# Patient Record
Sex: Female | Born: 1985 | Race: Black or African American | Hispanic: No | Marital: Single | Smoking: Never smoker
Health system: Southern US, Community
[De-identification: ages and names within clinical notes are randomized; demographics above are authoritative.]

## PROBLEM LIST (undated history)

## (undated) ENCOUNTER — Inpatient Hospital Stay (HOSPITAL_COMMUNITY): Payer: Self-pay

## (undated) DIAGNOSIS — B009 Herpesviral infection, unspecified: Secondary | ICD-10-CM

## (undated) DIAGNOSIS — Z789 Other specified health status: Secondary | ICD-10-CM

## (undated) DIAGNOSIS — L309 Dermatitis, unspecified: Secondary | ICD-10-CM

## (undated) DIAGNOSIS — R51 Headache: Secondary | ICD-10-CM

## (undated) HISTORY — PX: KNEE ARTHROSCOPY: SUR90

## (undated) HISTORY — PX: DILATION AND CURETTAGE OF UTERUS: SHX78

## (undated) HISTORY — PX: KNEE SURGERY: SHX244

---

## 2013-02-28 ENCOUNTER — Inpatient Hospital Stay (HOSPITAL_COMMUNITY)
Admission: AD | Admit: 2013-02-28 | Discharge: 2013-02-28 | Disposition: A | Discharge status: Home / Self Care | Payer: Self-pay | Source: Ambulatory Visit | Attending: Obstetrics & Gynecology | Admitting: Obstetrics & Gynecology

## 2013-02-28 ENCOUNTER — Inpatient Hospital Stay (HOSPITAL_COMMUNITY): Payer: Self-pay

## 2013-02-28 ENCOUNTER — Encounter (HOSPITAL_COMMUNITY): Payer: Self-pay | Admitting: Medical

## 2013-02-28 DIAGNOSIS — O26899 Other specified pregnancy related conditions, unspecified trimester: Secondary | ICD-10-CM

## 2013-02-28 DIAGNOSIS — N949 Unspecified condition associated with female genital organs and menstrual cycle: Secondary | ICD-10-CM | POA: Insufficient documentation

## 2013-02-28 DIAGNOSIS — R109 Unspecified abdominal pain: Secondary | ICD-10-CM | POA: Insufficient documentation

## 2013-02-28 DIAGNOSIS — Z3490 Encounter for supervision of normal pregnancy, unspecified, unspecified trimester: Secondary | ICD-10-CM

## 2013-02-28 DIAGNOSIS — O99891 Other specified diseases and conditions complicating pregnancy: Secondary | ICD-10-CM | POA: Insufficient documentation

## 2013-02-28 HISTORY — DX: Herpesviral infection, unspecified: B00.9

## 2013-02-28 LAB — CBC
MCH: 28.9 pg (ref 26.0–34.0)
MCHC: 36.5 g/dL — ABNORMAL HIGH (ref 30.0–36.0)
MCV: 79.1 fL (ref 78.0–100.0)
Platelets: 276 10*3/uL (ref 150–400)

## 2013-02-28 LAB — URINALYSIS, ROUTINE W REFLEX MICROSCOPIC
Bilirubin Urine: NEGATIVE
Glucose, UA: NEGATIVE mg/dL
Hgb urine dipstick: NEGATIVE
Specific Gravity, Urine: 1.01 (ref 1.005–1.030)
pH: 6.5 (ref 5.0–8.0)

## 2013-02-28 LAB — ABO/RH: ABO/RH(D): O POS

## 2013-02-28 NOTE — MAU Note (Signed)
Pt states she was having abdominal cramping for about 3wks before pt before she found out she was pregnant.Pt states she doesn't feel the pain anymore she feels hungry

## 2013-02-28 NOTE — MAU Note (Signed)
Patient states she has had a positive pregnancy test at [facility]. Has been having lower abdominal pain for about 2 weeks off and on. Denies bleeding but does have a discharge. Nausea, no vomiting.

## 2013-02-28 NOTE — MAU Provider Note (Signed)
History     CSN: [id number]  Arrival date and time: 02/28/13 1128   None     Chief Complaint  Patient presents with  . Possible Pregnancy  . Abdominal Pain   HPI Megan Pena is 27 y.o. G1P0 [redacted]w[redacted]d weeks presenting with 2 week history of abdominal pain and vaginal discharge.  She had a positive UPT at [facility].  They told her she had BV but discharge papers said PID--no med was Rx because she was pregnant.  Sexually active with 1 partner X 8 months.     Past Medical History  Diagnosis Date  . Herpes     Past Surgical History  Procedure Laterality Date  . Knee surgery      Family History  Problem Relation Age of Onset  . Cancer Mother   . Asthma Mother   . Cancer Father   . GER disease Brother     History  Substance Use Topics  . Smoking status: Never Smoker   . Smokeless tobacco: Not on file  . Alcohol Use: No    Allergies: No Known Allergies  Prescriptions prior to admission  Medication Sig Dispense Refill  . ibuprofen (ADVIL,MOTRIN) 200 MG tablet Take 400 mg by mouth every 8 (eight) hours as needed for pain or headache.      . triamcinolone cream (KENALOG) 0.1 % Apply 1 application topically 2 (two) times daily.        Review of Systems  Constitutional: Negative for fever and chills.  Gastrointestinal: Positive for abdominal pain. Negative for nausea and vomiting.  Genitourinary: Negative for dysuria, urgency and frequency.       + for vaginal discharge Neg for vaginal bleeding  Neurological: Negative for headaches.   Physical Exam   Blood pressure 123/77, pulse 84, temperature 98.4 F (36.9 C), temperature source Oral, resp. rate 16, height 5\' 2"  (1.575 m), weight 149 lb 14.4 oz (67.994 kg), last menstrual period 01/11/2013, SpO2 100.00%.  Physical Exam  Nursing note and vitals reviewed. Constitutional: She is oriented to person, place, and time. She appears well-developed and well-nourished. No distress.  HENT:  Head: Normocephalic.  Neck:  Normal range of motion.  Cardiovascular: Normal rate.   Respiratory: Effort normal.  GI: Soft. She exhibits no distension and no mass. There is no tenderness. There is no rebound and no guarding.  Genitourinary: There is no rash, tenderness or lesion on the right labia. There is no rash, tenderness or lesion on the left labia. Uterus is enlarged (slightly). Uterus is not tender. Cervix exhibits no motion tenderness, no discharge and no friability. Right adnexum displays no mass, no tenderness and no fullness. Left adnexum displays no mass, no tenderness and no fullness. No erythema, tenderness or bleeding around the vagina. Vaginal discharge (moderate amount of vaginal discharge that is frothy) found.  Neurological: She is alert and oriented to person, place, and time.  Skin: Skin is warm and dry.  Psychiatric: She has a normal mood and affect. Her behavior is normal.   Results for orders placed during the hospital encounter of 02/28/13 (from the past 24 hour(s))  URINALYSIS, ROUTINE W REFLEX MICROSCOPIC     Status: None   Collection Time    02/28/13 12:00 PM      Result Value Range   Color, Urine YELLOW  YELLOW   APPearance CLEAR  CLEAR   Specific Gravity, Urine 1.010  1.005 - 1.030   pH 6.5  5.0 - 8.0   Glucose, UA NEGATIVE  NEGATIVE mg/dL   Hgb urine dipstick NEGATIVE  NEGATIVE   Bilirubin Urine NEGATIVE  NEGATIVE   Ketones, ur NEGATIVE  NEGATIVE mg/dL   Protein, ur NEGATIVE  NEGATIVE mg/dL   Urobilinogen, UA 0.2  0.0 - 1.0 mg/dL   Nitrite NEGATIVE  NEGATIVE   Leukocytes, UA NEGATIVE  NEGATIVE  POCT PREGNANCY, URINE     Status: Abnormal   Collection Time    02/28/13 12:22 PM      Result Value Range   Preg Test, Ur POSITIVE (*) NEGATIVE  CBC     Status: Abnormal   Collection Time    02/28/13 12:36 PM      Result Value Range   WBC 7.2  4.0 - 10.5 K/uL   RBC 4.12  3.87 - 5.11 MIL/uL   Hemoglobin 11.9 (*) 12.0 - 15.0 g/dL   HCT 16.1 (*) 09.6 - 04.5 %   MCV 79.1  78.0 - 100.0 fL     MCH 28.9  26.0 - 34.0 pg   MCHC 36.5 (*) 30.0 - 36.0 g/dL   RDW 40.9  81.1 - 91.4 %   Platelets 276  150 - 400 K/uL  ABO/RH     Status: None   Collection Time    02/28/13 12:36 PM      Result Value Range   ABO/RH(D) O POS    HCG, QUANTITATIVE, PREGNANCY     Status: Abnormal   Collection Time    02/28/13 12:36 PM      Result Value Range   hCG, Beta Chain, Quant, S 78295 (*) <5 mIU/mL  WET PREP, GENITAL     Status: Abnormal   Collection Time    02/28/13  3:45 PM      Result Value Range   Yeast Wet Prep HPF POC NONE SEEN  NONE SEEN   Trich, Wet Prep NONE SEEN  NONE SEEN   Clue Cells Wet Prep HPF POC FEW (*) NONE SEEN   WBC, Wet Prep HPF POC MODERATE (*) NONE SEEN     MAU Course  Procedures GC/CHL culture to lab  MDM   Assessment and Plan  A: Viable intrauterine pregnancy at [redacted]w[redacted]d gestation     Abdominal pain in early pregnancy   P:  Begin prenatal care with doctor of your choice      Tylenol prn discomfort      Begin prenatal vitamins OTC       Daekwon Beswick,EVE M 02/28/2013, 4:17 PM

## 2013-03-01 LAB — GC/CHLAMYDIA PROBE AMP
CT Probe RNA: NEGATIVE
GC Probe RNA: NEGATIVE

## 2013-03-01 NOTE — MAU Provider Note (Signed)
Attestation of Attending Supervision of Advanced Practitioner (PA/CNM/NP): Evaluation and management procedures were performed by the Advanced Practitioner under my supervision and collaboration.  I have reviewed the Advanced Practitioner's note and chart, and I agree with the management and plan.  Malai Lady, MD, FACOG Attending Obstetrician & Gynecologist Faculty Practice, [facility]  

## 2013-07-12 ENCOUNTER — Encounter (HOSPITAL_BASED_OUTPATIENT_CLINIC_OR_DEPARTMENT_OTHER): Payer: Self-pay | Admitting: Emergency Medicine

## 2013-07-12 ENCOUNTER — Emergency Department (HOSPITAL_BASED_OUTPATIENT_CLINIC_OR_DEPARTMENT_OTHER): Payer: Medicaid Other

## 2013-07-12 ENCOUNTER — Emergency Department (HOSPITAL_BASED_OUTPATIENT_CLINIC_OR_DEPARTMENT_OTHER)
Admission: EM | Admit: 2013-07-12 | Discharge: 2013-07-12 | Disposition: A | Discharge status: Home / Self Care | Payer: Medicaid Other | Attending: Emergency Medicine | Admitting: Emergency Medicine

## 2013-07-12 DIAGNOSIS — R059 Cough, unspecified: Secondary | ICD-10-CM | POA: Insufficient documentation

## 2013-07-12 DIAGNOSIS — Z79899 Other long term (current) drug therapy: Secondary | ICD-10-CM | POA: Insufficient documentation

## 2013-07-12 DIAGNOSIS — R1110 Vomiting, unspecified: Secondary | ICD-10-CM | POA: Insufficient documentation

## 2013-07-12 DIAGNOSIS — R071 Chest pain on breathing: Secondary | ICD-10-CM | POA: Insufficient documentation

## 2013-07-12 DIAGNOSIS — R05 Cough: Secondary | ICD-10-CM | POA: Insufficient documentation

## 2013-07-12 DIAGNOSIS — Z8619 Personal history of other infectious and parasitic diseases: Secondary | ICD-10-CM | POA: Insufficient documentation

## 2013-07-12 DIAGNOSIS — IMO0002 Reserved for concepts with insufficient information to code with codable children: Secondary | ICD-10-CM | POA: Insufficient documentation

## 2013-07-12 DIAGNOSIS — J3489 Other specified disorders of nose and nasal sinuses: Secondary | ICD-10-CM | POA: Insufficient documentation

## 2013-07-12 MED ORDER — HYDROCODONE-ACETAMINOPHEN 7.5-325 MG/15ML PO SOLN
10.0000 mL | Freq: Once | ORAL | Status: AC
  Administered 2013-07-12: 10 mL via ORAL
  Filled 2013-07-12: qty 15

## 2013-07-12 MED ORDER — HYDROCODONE-HOMATROPINE 5-1.5 MG/5ML PO SYRP
5.0000 mL | ORAL_SOLUTION | Freq: Once | ORAL | Status: DC
  Filled 2013-07-12: qty 5

## 2013-07-12 MED ORDER — GUAIFENESIN-CODEINE 100-10 MG/5ML PO SOLN: 5.0000 mL | ORAL | Status: DC | PRN

## 2013-07-12 NOTE — ED Notes (Signed)
Returned from xray

## 2013-07-12 NOTE — ED Provider Notes (Signed)
CSN: [id number]     Arrival date & time 07/12/13  2144 History   First MD Initiated Contact with Patient 07/12/13 2303     Chief Complaint  Patient presents with  . Cough   (Consider location/radiation/quality/duration/timing/severity/associated sxs/prior Treatment) HPI Patient presents with 2-3 weeks of cough productive of yellow sputum. She says she does have occasional posttussive emesis. She has no shortness of breath has occasional anterior chest pain with coughing. She also admits to nasal congestion/rhinorrhea. No fevers or chills. He has no lower extremity swelling or pain. Past Medical History  Diagnosis Date  . Herpes    Past Surgical History  Procedure Laterality Date  . Knee surgery     Family History  Problem Relation Age of Onset  . Cancer Mother   . Asthma Mother   . Cancer Father   . GER disease Brother    History  Substance Use Topics  . Smoking status: Never Smoker   . Smokeless tobacco: Not on file  . Alcohol Use: No   OB History   Grav Para Term Preterm Abortions TAB SAB Ect Mult Living   1              Review of Systems  Constitutional: Negative for fever, chills and fatigue.  HENT: Positive for congestion and rhinorrhea. Negative for ear pain, sinus pressure and sore throat.   Respiratory: Positive for cough. Negative for shortness of breath and wheezing.   Cardiovascular: Positive for chest pain. Negative for palpitations and leg swelling.  Gastrointestinal: Negative for nausea, vomiting, abdominal pain and diarrhea.  Musculoskeletal: Negative for back pain, neck pain and neck stiffness.  Skin: Negative for rash and wound.  Neurological: Negative for dizziness, weakness, numbness and headaches.  All other systems reviewed and are negative.    Allergies  Review of patient's allergies indicates no known allergies.  Home Medications   Current Outpatient Rx  Name  Route  Sig  Dispense  Refill  . norgestimate-ethinyl estradiol  (ORTHO-CYCLEN,SPRINTEC,PREVIFEM) 0.25-35 MG-MCG tablet   Oral   Take 1 tablet by mouth daily.         Marland Kitchen guaiFENesin-codeine 100-10 MG/5ML syrup   Oral   Take 5 mLs by mouth every 4 (four) hours as needed for cough.   120 mL   0   . triamcinolone cream (KENALOG) 0.1 %   Topical   Apply 1 application topically 2 (two) times daily.          BP 119/73  Pulse 71  Temp(Src) 98.7 F (37.1 C) (Oral)  Resp 14  Ht 5\' 2"  (1.575 m)  Wt 140 lb (63.504 kg)  BMI 25.60 kg/m2  SpO2 99%  LMP 07/05/2013  Breastfeeding? No Physical Exam  Nursing note and vitals reviewed. Constitutional: She is oriented to person, place, and time. She appears well-developed and well-nourished. No distress.  HENT:  Head: Normocephalic and atraumatic.  Mouth/Throat: Oropharynx is clear and moist.  Eyes: EOM are normal. Pupils are equal, round, and reactive to light.  Neck: Normal range of motion. Neck supple.  Cardiovascular: Normal rate and regular rhythm.   Pulmonary/Chest: Effort normal and breath sounds normal. No respiratory distress. She has no wheezes. She has no rales. She exhibits tenderness (tenderness to chest reproduced with palpation of the sternum and parasternal area.no crepitance or deformity.).  Abdominal: Soft. Bowel sounds are normal. She exhibits no distension and no mass. There is no tenderness. There is no rebound and no guarding.  Musculoskeletal: Normal range of motion.  She exhibits no edema and no tenderness.  No calf swelling or tenderness.  Neurological: She is alert and oriented to person, place, and time.  Moves all extremities without deficit. Sensation grossly intact.  Skin: Skin is warm and dry. No rash noted. No erythema.  Psychiatric: She has a normal mood and affect. Her behavior is normal.    ED Course  Procedures (including critical care time) Labs Review Labs Reviewed - No data to display Imaging Review Dg Chest 2 View  07/12/2013   CLINICAL DATA:  Severe cough  for 3 weeks.  EXAM: CHEST  2 VIEW  COMPARISON:  None.  FINDINGS: The lungs are well-aerated and clear. There is no evidence of focal opacification, pleural effusion or pneumothorax.  The heart is normal in size; the mediastinal contour is within normal limits. No acute osseous abnormalities are seen.  IMPRESSION: No acute cardiopulmonary process seen.   Electronically Signed   By: Roanna Raider M.D.   On: 07/12/2013 22:28    EKG Interpretation   None       MDM   1. Cough    Chest x-ray without any evidence of pneumonia. Chest pain is consistent with chest wall pain. Is reproduced with palpation. We'll treat patient symptomatically. Return precautions have been given.    Loren Racer, MD 07/12/13 [id number]

## 2013-07-12 NOTE — ED Notes (Signed)
Pt sts coughing x 3weeks, chills, no fever, abdominal pain, denies vomiting/diarrhea, headache. Has taken Nyquil and tylenol cold and flu without relief.

## 2013-09-22 ENCOUNTER — Encounter (HOSPITAL_BASED_OUTPATIENT_CLINIC_OR_DEPARTMENT_OTHER): Payer: Self-pay | Admitting: Emergency Medicine

## 2013-09-22 ENCOUNTER — Emergency Department (HOSPITAL_BASED_OUTPATIENT_CLINIC_OR_DEPARTMENT_OTHER)
Admission: EM | Admit: 2013-09-22 | Discharge: 2013-09-22 | Disposition: A | Discharge status: Home / Self Care | Payer: Medicaid Other | Attending: Emergency Medicine | Admitting: Emergency Medicine

## 2013-09-22 DIAGNOSIS — R141 Gas pain: Secondary | ICD-10-CM | POA: Insufficient documentation

## 2013-09-22 DIAGNOSIS — O9989 Other specified diseases and conditions complicating pregnancy, childbirth and the puerperium: Secondary | ICD-10-CM | POA: Insufficient documentation

## 2013-09-22 DIAGNOSIS — J039 Acute tonsillitis, unspecified: Secondary | ICD-10-CM

## 2013-09-22 DIAGNOSIS — Z8619 Personal history of other infectious and parasitic diseases: Secondary | ICD-10-CM | POA: Insufficient documentation

## 2013-09-22 DIAGNOSIS — R143 Flatulence: Secondary | ICD-10-CM

## 2013-09-22 DIAGNOSIS — Z3490 Encounter for supervision of normal pregnancy, unspecified, unspecified trimester: Secondary | ICD-10-CM

## 2013-09-22 DIAGNOSIS — R142 Eructation: Secondary | ICD-10-CM | POA: Insufficient documentation

## 2013-09-22 DIAGNOSIS — IMO0002 Reserved for concepts with insufficient information to code with codable children: Secondary | ICD-10-CM | POA: Insufficient documentation

## 2013-09-22 DIAGNOSIS — Z79899 Other long term (current) drug therapy: Secondary | ICD-10-CM | POA: Insufficient documentation

## 2013-09-22 DIAGNOSIS — H6690 Otitis media, unspecified, unspecified ear: Secondary | ICD-10-CM

## 2013-09-22 LAB — PREGNANCY, URINE: Preg Test, Ur: POSITIVE — AB

## 2013-09-22 MED ORDER — AMOXICILLIN 250 MG PO CAPS: 250.0000 mg | ORAL_CAPSULE | Freq: Three times a day (TID) | ORAL | Status: DC

## 2013-09-22 NOTE — ED Notes (Signed)
Onset of right ear pain, sore throat on Thursday.  Denies fever, vomiting.  C/o mild nausea.  Denies cough, runny nose, sinus pressure.

## 2013-09-22 NOTE — ED Provider Notes (Signed)
CSN: [id number]     Arrival date & time 09/22/13  1623 History   First MD Initiated Contact with Patient 09/22/13 1632     Chief Complaint  Patient presents with  . Otalgia     (Consider location/radiation/quality/duration/timing/severity/associated sxs/prior Treatment) Patient is a 28 y.o. female presenting with ear pain. The history is provided by the patient.  Otalgia Location:  Right Quality:  Aching Severity:  Moderate Onset quality:  Gradual Duration:  2 days Timing:  Constant Progression:  Worsening Chronicity:  New Relieved by:  None tried Worsened by:  Nothing tried Associated symptoms: congestion, rhinorrhea and sore throat   Associated symptoms: no cough, no diarrhea, no fever, no headaches, no rash and no vomiting    Geralene Weisel is a 28 y.o. female who presents to the ED with sore throat, gland swelling, chills, ear ache and sinus pressure that started 2 days ago. She is also late for her period. She is having breast tenderness.  Past Medical History  Diagnosis Date  . Herpes    Past Surgical History  Procedure Laterality Date  . Knee surgery     Family History  Problem Relation Age of Onset  . Cancer Mother   . Asthma Mother   . Cancer Father   . GER disease Brother    History  Substance Use Topics  . Smoking status: Never Smoker   . Smokeless tobacco: Not on file  . Alcohol Use: No   OB History   Grav Para Term Preterm Abortions TAB SAB Ect Mult Living   1              Review of Systems  Constitutional: Positive for chills. Negative for fever.  HENT: Positive for congestion, ear pain, rhinorrhea and sore throat. Negative for trouble swallowing.   Respiratory: Negative for cough.   Gastrointestinal: Negative for nausea, vomiting and diarrhea.       Abdominal bloating  Genitourinary: Negative for dysuria and urgency.  Musculoskeletal: Negative for myalgias.  Skin: Negative for rash.  Neurological: Negative for light-headedness and headaches.   Psychiatric/Behavioral: Negative for confusion. The patient is not nervous/anxious.       Allergies  Review of patient's allergies indicates no known allergies.  Home Medications   Current Outpatient Rx  Name  Route  Sig  Dispense  Refill  . guaiFENesin-codeine 100-10 MG/5ML syrup   Oral   Take 5 mLs by mouth every 4 (four) hours as needed for cough.   120 mL   0   . norgestimate-ethinyl estradiol (ORTHO-CYCLEN,SPRINTEC,PREVIFEM) 0.25-35 MG-MCG tablet   Oral   Take 1 tablet by mouth daily.         Marland Kitchen triamcinolone cream (KENALOG) 0.1 %   Topical   Apply 1 application topically 2 (two) times daily.          BP 113/66  Pulse 95  Temp(Src) 98.7 F (37.1 C) (Oral)  Resp 20  Ht 5\' 2"  (1.575 m)  Wt 142 lb (64.411 kg)  BMI 25.97 kg/m2  SpO2 100%  LMP 08/21/2013 Physical Exam  Nursing note and vitals reviewed. Constitutional: She is oriented to person, place, and time. She appears well-developed and well-nourished.  HENT:  Head: Normocephalic.  Right Ear: Tympanic membrane is injected and retracted.  Left Ear: Tympanic membrane normal.  Nose: Right sinus exhibits maxillary sinus tenderness. Left sinus exhibits maxillary sinus tenderness.  Mouth/Throat: Uvula is midline and mucous membranes are normal. Oropharyngeal exudate and posterior oropharyngeal erythema present.  Eyes: EOM  are normal.  Neck: Neck supple.  Cardiovascular: Normal rate and regular rhythm.   Pulmonary/Chest: Effort normal. She has no wheezes. She has no rales.  Abdominal: Soft. Bowel sounds are normal. There is no tenderness.  Musculoskeletal: Normal range of motion.  Lymphadenopathy:    She has cervical adenopathy.  Neurological: She is alert and oriented to person, place, and time. No cranial nerve deficit.  Skin: Skin is warm and dry.  Psychiatric: She has a normal mood and affect. Her behavior is normal.   Results for orders placed during the hospital encounter of 09/22/13 (from the past  24 hour(s))  PREGNANCY, URINE     Status: Abnormal   Collection Time    09/22/13  5:01 PM      Result Value Ref Range   Preg Test, Ur POSITIVE (*) NEGATIVE    ED Course  Procedures  MDM  28 y.o. female with ear ache, sore throat and sinus pressure. She also has breast tenderness and amenorrhea. Positive pregnancy test. Stable for discharge with treatment for tonsillitis and otitis with antibiotics. She will start her prenatal care.  Discussed with the patient and all questioned fully answered. She will return if any problems arise.    Medication List    TAKE these medications       amoxicillin 250 MG capsule  Commonly known as:  AMOXIL  Take 1 capsule (250 mg total) by mouth 3 (three) times daily.      ASK your doctor about these medications       guaiFENesin-codeine 100-10 MG/5ML syrup  Take 5 mLs by mouth every 4 (four) hours as needed for cough.     norgestimate-ethinyl estradiol 0.25-35 MG-MCG tablet  Commonly known as:  ORTHO-CYCLEN,SPRINTEC,PREVIFEM  Take 1 tablet by mouth daily.     triamcinolone cream 0.1 %  Commonly known as:  KENALOG  Apply 1 application topically 2 (two) times daily.           [facility] Orlene OchM Paislea Hatton, [place]NP 09/22/13 1721

## 2013-09-22 NOTE — ED Notes (Signed)
EDNP Hope at bedside.

## 2013-09-22 NOTE — ED Notes (Signed)
Pt d/c with family- rx x 1 for amoxicillin

## 2013-09-22 NOTE — ED Provider Notes (Signed)
Medical screening examination/treatment/procedure(s) were performed by non-physician practitioner and as supervising physician I was immediately available for consultation/collaboration.   EKG Interpretation None       Doug SouSam Judythe Postema, MD 09/22/13 1910

## 2013-09-24 ENCOUNTER — Encounter (HOSPITAL_COMMUNITY): Payer: Self-pay | Admitting: *Deleted

## 2013-09-24 ENCOUNTER — Inpatient Hospital Stay (HOSPITAL_COMMUNITY): Payer: Medicaid Other

## 2013-09-24 ENCOUNTER — Inpatient Hospital Stay (HOSPITAL_COMMUNITY)
Admission: AD | Admit: 2013-09-24 | Discharge: 2013-09-24 | Disposition: A | Discharge status: Home / Self Care | Payer: Medicaid Other | Source: Ambulatory Visit | Attending: Obstetrics & Gynecology | Admitting: Obstetrics & Gynecology

## 2013-09-24 DIAGNOSIS — R109 Unspecified abdominal pain: Secondary | ICD-10-CM

## 2013-09-24 DIAGNOSIS — O26899 Other specified pregnancy related conditions, unspecified trimester: Secondary | ICD-10-CM

## 2013-09-24 DIAGNOSIS — N760 Acute vaginitis: Secondary | ICD-10-CM | POA: Insufficient documentation

## 2013-09-24 DIAGNOSIS — O2390 Unspecified genitourinary tract infection in pregnancy, unspecified trimester: Secondary | ICD-10-CM | POA: Insufficient documentation

## 2013-09-24 DIAGNOSIS — B9689 Other specified bacterial agents as the cause of diseases classified elsewhere: Secondary | ICD-10-CM | POA: Insufficient documentation

## 2013-09-24 DIAGNOSIS — A499 Bacterial infection, unspecified: Secondary | ICD-10-CM | POA: Insufficient documentation

## 2013-09-24 HISTORY — DX: Headache: R51

## 2013-09-24 LAB — URINALYSIS, ROUTINE W REFLEX MICROSCOPIC
Bilirubin Urine: NEGATIVE
GLUCOSE, UA: NEGATIVE mg/dL
HGB URINE DIPSTICK: NEGATIVE
KETONES UR: NEGATIVE mg/dL
LEUKOCYTES UA: NEGATIVE
Nitrite: NEGATIVE
PH: 7 (ref 5.0–8.0)
Protein, ur: NEGATIVE mg/dL
Specific Gravity, Urine: 1.02 (ref 1.005–1.030)
Urobilinogen, UA: 0.2 mg/dL (ref 0.0–1.0)

## 2013-09-24 LAB — CBC
HEMATOCRIT: 33.7 % — AB (ref 36.0–46.0)
HEMOGLOBIN: 12.8 g/dL (ref 12.0–15.0)
MCH: 29.7 pg (ref 26.0–34.0)
MCHC: 38 g/dL — ABNORMAL HIGH (ref 30.0–36.0)
MCV: 78.2 fL (ref 78.0–100.0)
Platelets: 314 10*3/uL (ref 150–400)
RBC: 4.31 MIL/uL (ref 3.87–5.11)
RDW: 13.8 % (ref 11.5–15.5)
WBC: 8.4 10*3/uL (ref 4.0–10.5)

## 2013-09-24 LAB — HCG, QUANTITATIVE, PREGNANCY: hCG, Beta Chain, Quant, S: 680 m[IU]/mL — ABNORMAL HIGH (ref <5)

## 2013-09-24 LAB — WET PREP, GENITAL
TRICH WET PREP: NONE SEEN
YEAST WET PREP: NONE SEEN

## 2013-09-24 MED ORDER — METRONIDAZOLE 0.75 % VA GEL: 1.0000 | Freq: Every day | VAGINAL | Status: DC

## 2013-09-24 NOTE — MAU Note (Signed)
Pt has intermittent lower abd pain for the past three weeks.  Denies pain at present.  Denies vag bleeding or dysuria.

## 2013-09-24 NOTE — MAU Note (Signed)
Lower abd pain x 3 weeks, pos HPT & also [facility].  Pt states she has been leaking breast milk ever since a TAB last year.  Is occasionally bloody.

## 2013-09-24 NOTE — MAU Provider Note (Signed)
Chief Complaint: Abdominal Pain   First Provider Initiated Contact with Patient 09/24/13 1949     SUBJECTIVE HPI: Megan Pena is a 28 y.o. G3P0010 at [redacted]w[redacted]d by LMP who presents to maternity admissions reporting abdominal cramping x3 weeks described as mild to moderate and intermittent.  She also reports leakage of milk from both breasts when she squeezes them, starting in September following a termination.  She has had a small amount of pink bleeding from both breasts as well.  She does not report any leakage from the breast without direct stimulation.  She does plan to receive prenatal care for this pregnancy.  She denies vaginal bleeding, vaginal itching/burning, urinary symptoms, h/a, dizziness, n/v, or fever/chills.     Past Medical History  Diagnosis Date  . Herpes   . WGNFAOZH(086.5)    Past Surgical History  Procedure Laterality Date  . Knee surgery     History   Social History  . Marital Status: Single    Spouse Name: N/A    Number of Children: N/A  . Years of Education: N/A   Occupational History  . Not on file.   Social History Main Topics  . Smoking status: Never Smoker   . Smokeless tobacco: Not on file  . Alcohol Use: No  . Drug Use: No  . Sexual Activity: Yes     Comment: last sex four weeks ago   Other Topics Concern  . Not on file   Social History Narrative  . No narrative on file   No current facility-administered medications on file prior to encounter.   Current Outpatient Prescriptions on File Prior to Encounter  Medication Sig Dispense Refill  . amoxicillin (AMOXIL) 250 MG capsule Take 1 capsule (250 mg total) by mouth 3 (three) times daily.  30 capsule  0   Allergies  Allergen Reactions  . Tomato Other (See Comments)    acne    ROS: Pertinent items in HPI  OBJECTIVE Blood pressure 117/66, pulse 78, temperature 98.1 F (36.7 C), temperature source Oral, resp. rate 16, height 5\' 2"  (1.575 m), weight 64.048 kg (141 lb 3.2 oz), last menstrual  period 08/21/2013. GENERAL: Well-developed, well-nourished female in no acute distress.  HEENT: Normocephalic HEART: normal rate RESP: normal effort ABDOMEN: Soft, non-tender EXTREMITIES: Nontender, no edema NEURO: Alert and oriented Pelvic exam: Cervix pink, visually closed, without lesion, moderate amount thick white discharge, vaginal walls and external genitalia normal Bimanual exam: Cervix 0/long/high, firm, anterior, neg CMT, uterus nontender, nonenlarged, adnexa without tenderness, enlargement, or mass  LAB RESULTS Results for orders placed during the hospital encounter of 09/24/13 (from the past 24 hour(s))  URINALYSIS, ROUTINE W REFLEX MICROSCOPIC     Status: None   Collection Time    09/24/13  7:00 PM      Result Value Ref Range   Color, Urine YELLOW  YELLOW   APPearance CLEAR  CLEAR   Specific Gravity, Urine 1.020  1.005 - 1.030   pH 7.0  5.0 - 8.0   Glucose, UA NEGATIVE  NEGATIVE mg/dL   Hgb urine dipstick NEGATIVE  NEGATIVE   Bilirubin Urine NEGATIVE  NEGATIVE   Ketones, ur NEGATIVE  NEGATIVE mg/dL   Protein, ur NEGATIVE  NEGATIVE mg/dL   Urobilinogen, UA 0.2  0.0 - 1.0 mg/dL   Nitrite NEGATIVE  NEGATIVE   Leukocytes, UA NEGATIVE  NEGATIVE  HCG, QUANTITATIVE, PREGNANCY     Status: Abnormal   Collection Time    09/24/13  7:59 PM  Result Value Ref Range   hCG, Beta Chain, Quant, S 680 (*) <5 mIU/mL  CBC     Status: Abnormal   Collection Time    09/24/13  7:59 PM      Result Value Ref Range   WBC 8.4  4.0 - 10.5 K/uL   RBC 4.31  3.87 - 5.11 MIL/uL   Hemoglobin 12.8  12.0 - 15.0 g/dL   HCT 16.1 (*) 09.6 - 04.5 %   MCV 78.2  78.0 - 100.0 fL   MCH 29.7  26.0 - 34.0 pg   MCHC 38.0 (*) 30.0 - 36.0 g/dL   RDW 40.9  81.1 - 91.4 %   Platelets 314  150 - 400 K/uL  WET PREP, GENITAL     Status: Abnormal   Collection Time    09/24/13  8:12 PM      Result Value Ref Range   Yeast Wet Prep HPF POC NONE SEEN  NONE SEEN   Trich, Wet Prep NONE SEEN  NONE SEEN   Clue  Cells Wet Prep HPF POC MODERATE (*) NONE SEEN   WBC, Wet Prep HPF POC MODERATE (*) NONE SEEN    IMAGING US Ob Comp Less 14 Wks  09/24/2013   CLINICAL DATA:  Pelvic pain. Quantitative beta HCG is pending. Gravida 3 para 1. LMP 08/21/2013.  EXAM: OBSTETRIC <14 WK [place] AND TRANSVAGINAL OB US  TECHNIQUE: Both transabdominal and transvaginal ultrasound examinations were performed for complete evaluation of the gestation as well as the maternal uterus, adnexal regions, and pelvic cul-de-sac. Transvaginal technique was performed to assess early pregnancy.  COMPARISON:  None applicable.  FINDINGS: Intrauterine gestational sac: None  Yolk sac:  None  Embryo:  None  Cardiac Activity: None  Maternal uterus/adnexae: The ovaries have a normal appearance.  IMPRESSION: 1. No evidence for intrauterine or adnexal pregnancy. 2. Serial quantitative beta HCG values and follow-up ultrasound are recommended as appropriate to document progression of and location of pregnancy. Ectopic pregnancy has not been excluded.   Electronically Signed   By: Rosalie Gums M.D.   On: 09/24/2013 20:50   US Ob Transvaginal  09/24/2013   CLINICAL DATA:  Pelvic pain. Quantitative beta HCG is pending. Gravida 3 para 1. LMP 08/21/2013.  EXAM: OBSTETRIC <14 WK [place] AND TRANSVAGINAL OB US  TECHNIQUE: Both transabdominal and transvaginal ultrasound examinations were performed for complete evaluation of the gestation as well as the maternal uterus, adnexal regions, and pelvic cul-de-sac. Transvaginal technique was performed to assess early pregnancy.  COMPARISON:  None applicable.  FINDINGS: Intrauterine gestational sac: None  Yolk sac:  None  Embryo:  None  Cardiac Activity: None  Maternal uterus/adnexae: The ovaries have a normal appearance.  IMPRESSION: 1. No evidence for intrauterine or adnexal pregnancy. 2. Serial quantitative beta HCG values and follow-up ultrasound are recommended as appropriate to document progression of and location of pregnancy.  Ectopic pregnancy has not been excluded.   Electronically Signed   By: Rosalie Gums M.D.   On: 09/24/2013 20:50    ASSESSMENT 1. Abdominal pain complicating pregnancy   2. BV (bacterial vaginosis)     PLAN Discharge home Plan to follow up in 48 hours in [facility] for repeat quant hcg Metrogel Q HS x5 days Recommend the pt stop stimulating breasts that likely leakage is normal.  She should have follow up breast exam by provider if she notes bleeding without stimulation.  Return to MAU as needed    Medication List  amoxicillin 250 MG capsule  Commonly known as:  AMOXIL  Take 1 capsule (250 mg total) by mouth 3 (three) times daily.     metroNIDAZOLE 0.75 % vaginal gel  Commonly known as:  METROGEL  Place 1 Applicatorful vaginally at bedtime. Apply one applicatorful to vagina at bedtime for 5 days       Follow-up Information   [facility] ([facility] at 9 am on [facility] 09[facility]/13 [facility].  Return to MAU as needed if symptoms worsen.)    Specialty:  Obstetrics and Gynecology   Contact information:   [address] [place] [address] [phone]      Sharen CounterLisa Leftwich-Kirby Certified Nurse-Midwife 09/24/2013  10:29 PM

## 2013-09-24 NOTE — Discharge Instructions (Signed)
Abdominal Pain During Pregnancy °Abdominal pain is common in pregnancy. Most of the time, it does not cause harm. There are many causes of abdominal pain. Some causes are more serious than others. Some of the causes of abdominal pain in pregnancy are easily diagnosed. Occasionally, the diagnosis takes time to understand. Other times, the cause is not determined. Abdominal pain can be a sign that something is very wrong with the pregnancy, or the pain may have nothing to do with the pregnancy at all. For this reason, always tell your health care provider if you have any abdominal discomfort. °HOME CARE INSTRUCTIONS  °Monitor your abdominal pain for any changes. The following actions may help to alleviate any discomfort you are experiencing: °· Do not have sexual intercourse or put anything in your vagina until your symptoms go away completely. °· Get plenty of rest until your pain improves. °· Drink clear fluids if you feel nauseous. Avoid solid food as long as you are uncomfortable or nauseous. °· Only take over-the-counter or prescription medicine as directed by your health care provider. °· Keep all follow-up appointments with your health care provider. °SEEK IMMEDIATE MEDICAL CARE IF: °· You are bleeding, leaking fluid, or passing tissue from the vagina. °· You have increasing pain or cramping. °· You have persistent vomiting. °· You have painful or bloody urination. °· You have a fever. °· You notice a decrease in your baby's movements. °· You have extreme weakness or feel faint. °· You have shortness of breath, with or without abdominal pain. °· You develop a severe headache with abdominal pain. °· You have abnormal vaginal discharge with abdominal pain. °· You have persistent diarrhea. °· You have abdominal pain that continues even after rest, or gets worse. °MAKE SURE YOU:  °· Understand these instructions. °· Will watch your condition. °· Will get help right away if you are not doing well or get  worse. °Document Released: 07/05/2005 Document Revised: 04/25/2013 Document Reviewed: 02/01/2013 °ExitCare® Patient Information ©2014 [employer]. ° °

## 2013-09-25 ENCOUNTER — Telehealth: Payer: Self-pay

## 2013-09-25 LAB — GC/CHLAMYDIA PROBE AMP
CT PROBE, AMP APTIMA: NEGATIVE
GC Probe RNA: NEGATIVE

## 2013-09-25 NOTE — Telephone Encounter (Signed)
Called pt. No answer. Left message stating we are calling with results and of a prescription sent to her pharmacy. Please call clinic.

## 2013-09-25 NOTE — Telephone Encounter (Signed)
Message copied by Louanna RawAMPBELL, Bufford Helms M on Tue Sep 25, 2013  8:11 AM ------      Message from: LEFTWICH-KIRBY, LISA A      Created: Mon Sep 24, 2013  9:29 PM      Regarding: Follow up on lab done in [facility]       I saw this early pregnancy pt in [facility] on 09/24/13 and did not discuss her BV diagnosis with her.  She is following up in clinic on Thursday 3/12 for repeat labs.  Could someone call her or tell her in clinic that she has prescription for Metrogel QHS x5 days at her pharmacy?  Thank you.  ------

## 2013-09-27 ENCOUNTER — Other Ambulatory Visit: Payer: Medicaid Other

## 2013-09-27 DIAGNOSIS — O209 Hemorrhage in early pregnancy, unspecified: Secondary | ICD-10-CM

## 2013-09-27 DIAGNOSIS — O021 Missed abortion: Secondary | ICD-10-CM

## 2013-09-27 DIAGNOSIS — Z3200 Encounter for pregnancy test, result unknown: Secondary | ICD-10-CM

## 2013-09-27 LAB — HCG, QUANTITATIVE, PREGNANCY: hCG, Beta Chain, Quant, S: 2088.4 m[IU]/mL

## 2013-09-27 NOTE — Telephone Encounter (Signed)
Pt. Here today in clinic. Informed her of BV and medication at pharmacy. Pt. Verbalized understanding. Pt. Then stated the past month she has noticed epsiodes of hot flashes and a slight bloody nose. Pt. States she found out she had been pregnant Saturday but states she was 4 weeks. Informed pt. The hot flashes could have been related to the hormones of pregnancy. Pt. States it has happened since she found out she lost the baby. Informed her it could occur with the falling hormones but that if she notices it next week and it continues to follow up with her PCP. Pt. Stated she was going to get a PCP and that she would do that. No other questions or concerns.

## 2013-09-27 NOTE — Progress Notes (Signed)
[place] a stat bhcg drawn today to confirm pregnancy. Result [id number] reported to Dr. Debroah LoopArnold. He reviewed her chart. Orders to get Ultrasound to confirm pregnancy. Called patient and gave her results and ultrasound appointment.  [place] voices understanding.

## 2013-10-01 ENCOUNTER — Inpatient Hospital Stay (HOSPITAL_COMMUNITY)
Admission: AD | Admit: 2013-10-01 | Discharge: 2013-10-01 | Disposition: A | Discharge status: Home / Self Care | Payer: Medicaid Other | Source: Ambulatory Visit | Attending: Obstetrics and Gynecology | Admitting: Obstetrics and Gynecology

## 2013-10-01 ENCOUNTER — Inpatient Hospital Stay (HOSPITAL_COMMUNITY): Payer: Medicaid Other

## 2013-10-01 ENCOUNTER — Encounter (HOSPITAL_COMMUNITY): Payer: Self-pay | Admitting: *Deleted

## 2013-10-01 DIAGNOSIS — O99891 Other specified diseases and conditions complicating pregnancy: Secondary | ICD-10-CM | POA: Insufficient documentation

## 2013-10-01 DIAGNOSIS — O210 Mild hyperemesis gravidarum: Secondary | ICD-10-CM | POA: Insufficient documentation

## 2013-10-01 DIAGNOSIS — N898 Other specified noninflammatory disorders of vagina: Secondary | ICD-10-CM | POA: Insufficient documentation

## 2013-10-01 DIAGNOSIS — R109 Unspecified abdominal pain: Secondary | ICD-10-CM | POA: Insufficient documentation

## 2013-10-01 DIAGNOSIS — N939 Abnormal uterine and vaginal bleeding, unspecified: Secondary | ICD-10-CM

## 2013-10-01 DIAGNOSIS — O26899 Other specified pregnancy related conditions, unspecified trimester: Secondary | ICD-10-CM

## 2013-10-01 DIAGNOSIS — O26859 Spotting complicating pregnancy, unspecified trimester: Secondary | ICD-10-CM | POA: Insufficient documentation

## 2013-10-01 DIAGNOSIS — O9989 Other specified diseases and conditions complicating pregnancy, childbirth and the puerperium: Principal | ICD-10-CM

## 2013-10-01 LAB — URINALYSIS, ROUTINE W REFLEX MICROSCOPIC
BILIRUBIN URINE: NEGATIVE
Glucose, UA: NEGATIVE mg/dL
Hgb urine dipstick: NEGATIVE
KETONES UR: 15 mg/dL — AB
LEUKOCYTES UA: NEGATIVE
Nitrite: NEGATIVE
PROTEIN: NEGATIVE mg/dL
Specific Gravity, Urine: 1.025 (ref 1.005–1.030)
Urobilinogen, UA: 0.2 mg/dL (ref 0.0–1.0)
pH: 6 (ref 5.0–8.0)

## 2013-10-01 LAB — WET PREP, GENITAL
Clue Cells Wet Prep HPF POC: NONE SEEN
TRICH WET PREP: NONE SEEN
YEAST WET PREP: NONE SEEN

## 2013-10-01 NOTE — Discharge Instructions (Signed)
Pregnancy - First Trimester  During sexual intercourse, millions of sperm go into the vagina. Only 1 sperm will penetrate and fertilize the female egg while it is in the Fallopian tube. One week later, the fertilized egg implants into the wall of the uterus. An embryo begins to develop into a baby. At 6 to 8 weeks, the eyes and face are formed and the heartbeat can be seen on ultrasound. At the end of 12 weeks (first trimester), all the baby's organs are formed. Now that you are pregnant, you will want to do everything you can to have a healthy baby. Two of the most important things are to get good prenatal care and follow your caregiver's instructions. Prenatal care is all the medical care you receive before the baby's birth. It is given to prevent, find, and treat problems during the pregnancy and childbirth.  PRENATAL EXAMS  · During prenatal visits, your weight, blood pressure, and urine are checked. This is done to make sure you are healthy and progressing normally during the pregnancy.  · A pregnant woman should gain 25 to 35 pounds during the pregnancy. However, if you are overweight or underweight, your caregiver will advise you regarding your weight.  · Your caregiver will ask and answer questions for you.  · Blood work, cervical cultures, other necessary tests, and a Pap test are done during your prenatal exams. These tests are done to check on your health and the probable health of your baby. Tests are strongly recommended and done for HIV with your permission. This is the virus that causes AIDS. These tests are done because medicines can be given to help prevent your baby from being born with this infection should you have been infected without knowing it. Blood work is also used to find out your blood type, previous infections, and follow your blood levels (hemoglobin).  · Low hemoglobin (anemia) is common during pregnancy. Iron and vitamins are given to help prevent this. Later in the pregnancy, blood  tests for diabetes will be done along with any other tests if any problems develop.  · You may need other tests to make sure you and the baby are doing well.  CHANGES DURING THE FIRST TRIMESTER   Your body goes through many changes during pregnancy. They vary from person to person. Talk to your caregiver about changes you notice and are concerned about. Changes can include:  · Your menstrual period stops.  · The egg and sperm carry the genes that determine what you look like. Genes from you and your partner are forming a baby. The female genes determine whether the baby is a boy or a girl.  · Your body increases in girth and you may feel bloated.  · Feeling sick to your stomach (nauseous) and throwing up (vomiting). If the vomiting is uncontrollable, call your caregiver.  · Your breasts will begin to enlarge and become tender.  · Your nipples may stick out more and become darker.  · The need to urinate more. Painful urination may mean you have a bladder infection.  · Tiring easily.  · Loss of appetite.  · Cravings for certain kinds of food.  · At first, you may gain or lose a couple of pounds.  · You may have changes in your emotions from day to day (excited to be pregnant or concerned something may go wrong with the pregnancy and baby).  · You may have more vivid and strange dreams.  HOME CARE INSTRUCTIONS   ·   It is very important to avoid all smoking, alcohol and non-prescribed drugs during your pregnancy. These affect the formation and growth of the baby. Avoid chemicals while pregnant to ensure the delivery of a healthy infant.  · Start your prenatal visits by the 12th week of pregnancy. They are usually scheduled monthly at first, then more often in the last 2 months before delivery. Keep your caregiver's appointments. Follow your caregiver's instructions regarding medicine use, blood and lab tests, exercise, and diet.  · During pregnancy, you are providing food for you and your baby. Eat regular, well-balanced  meals. Choose foods such as meat, fish, milk and other low fat dairy products, vegetables, fruits, and whole-grain breads and cereals. Your caregiver will tell you of the ideal weight gain.  · You can help morning sickness by keeping soda crackers at the bedside. Eat a couple before arising in the morning. You may want to use the crackers without salt on them.  · Eating 4 to 5 small meals rather than 3 large meals a day also may help the nausea and vomiting.  · Drinking liquids between meals instead of during meals also seems to help nausea and vomiting.  · A physical sexual relationship may be continued throughout pregnancy if there are no other problems. Problems may be early (premature) leaking of amniotic fluid from the membranes, vaginal bleeding, or belly (abdominal) pain.  · Exercise regularly if there are no restrictions. Check with your caregiver or physical therapist if you are unsure of the safety of some of your exercises. Greater weight gain will occur in the last 2 trimesters of pregnancy. Exercising will help:  · Control your weight.  · Keep you in shape.  · Prepare you for labor and delivery.  · Help you lose your pregnancy weight after you deliver your baby.  · Wear a good support or jogging bra for breast tenderness during pregnancy. This may help if worn during sleep too.  · Ask when prenatal classes are available. Begin classes when they are offered.  · Do not use hot tubs, steam rooms, or saunas.  · Wear your seat belt when driving. This protects you and your baby if you are in an accident.  · Avoid raw meat, uncooked cheese, cat litter boxes, and soil used by cats throughout the pregnancy. These carry germs that can cause birth defects in the baby.  · The first trimester is a good time to visit your dentist for your dental health. Getting your teeth cleaned is okay. Use a softer toothbrush and brush gently during pregnancy.  · Ask for help if you have financial, counseling, or nutritional needs  during pregnancy. Your caregiver will be able to offer counseling for these needs as well as refer you for other special needs.  · Do not take any medicines or herbs unless told by your caregiver.  · Inform your caregiver if there is any mental or physical domestic violence.  · Make a list of emergency phone numbers of family, friends, hospital, and police and fire departments.  · Write down your questions. Take them to your prenatal visit.  · Do not douche.  · Do not cross your legs.  · If you have to stand for long periods of time, rotate you feet or take small steps in a circle.  · You may have more vaginal secretions that may require a sanitary pad. Do not use tampons or scented sanitary pads.  MEDICINES AND DRUG USE IN PREGNANCY  ·   Take prenatal vitamins as directed. The vitamin should contain 1 milligram of folic acid. Keep all vitamins out of reach of children. Only a couple vitamins or tablets containing iron may be fatal to a baby or young child when ingested.  · Avoid use of all medicines, including herbs, over-the-counter medicines, not prescribed or suggested by your caregiver. Only take over-the-counter or prescription medicines for pain, discomfort, or fever as directed by your caregiver. Do not use aspirin, ibuprofen, or naproxen unless directed by your caregiver.  · Let your caregiver also know about herbs you may be using.  · Alcohol is related to a number of birth defects. This includes fetal alcohol syndrome. All alcohol, in any form, should be avoided completely. Smoking will cause low birth rate and premature babies.  · Street or illegal drugs are very harmful to the baby. They are absolutely forbidden. A baby born to an addicted mother will be addicted at birth. The baby will go through the same withdrawal an adult does.  · Let your caregiver know about any medicines that you have to take and for what reason you take them.  SEEK MEDICAL CARE IF:   You have any concerns or worries during your  pregnancy. It is better to call with your questions if you feel they cannot wait, rather than worry about them.  SEEK IMMEDIATE MEDICAL CARE IF:   · An unexplained oral temperature above 102° F (38.9° C) develops, or as your caregiver suggests.  · You have leaking of fluid from the vagina (birth canal). If leaking membranes are suspected, take your temperature and inform your caregiver of this when you call.  · There is vaginal spotting or bleeding. Notify your caregiver of the amount and how many pads are used.  · You develop a bad smelling vaginal discharge with a change in the color.  · You continue to feel sick to your stomach (nauseated) and have no relief from remedies suggested. You vomit blood or coffee ground-like materials.  · You lose more than 2 pounds of weight in 1 week.  · You gain more than 2 pounds of weight in 1 week and you notice swelling of your face, hands, feet, or legs.  · You gain 5 pounds or more in 1 week (even if you do not have swelling of your hands, face, legs, or feet).  · You get exposed to German measles and have never had them.  · You are exposed to fifth disease or chickenpox.  · You develop belly (abdominal) pain. Round ligament discomfort is a common non-cancerous (benign) cause of abdominal pain in pregnancy. Your caregiver still must evaluate this.  · You develop headache, fever, diarrhea, pain with urination, or shortness of breath.  · You fall or are in a car accident or have any kind of trauma.  · There is mental or physical violence in your home.  Document Released: 06/29/2001 Document Revised: 03/29/2012 Document Reviewed: 12/31/2008  ExitCare® Patient Information ©2014 [employer].

## 2013-10-01 NOTE — MAU Provider Note (Signed)
Chief Complaint: Abdominal Pain, Vaginal Discharge and Nausea   First Provider Initiated Contact with Patient 10/01/13 1032     SUBJECTIVE HPI: Megan Pena is a [age] y.o. G2P0010 at 8558w6d by LMP who presents to maternity admissions reporting abdominal cramping and yellow vaginal discharge starting yesterday.  She had a normal rise in her quantitative hcg from 3/9 to 3/12 and has u/s scheduled on Friday to confirm viability/rule out ectopic.  She denies LOF, vaginal itching/burning, urinary symptoms, h/a, dizziness, vomiting, or fever/chills.     Past Medical History  Diagnosis Date  . Herpes   . ZOXWRUEA(540.9Headache(784.0)    Past Surgical History  Procedure Laterality Date  . Knee surgery     History   Social History  . Marital Status: Single    Spouse Name: N/A    Number of Children: N/A  . Years of Education: N/A   Occupational History  . Not on file.   Social History Main Topics  . Smoking status: Never Smoker   . Smokeless tobacco: Not on file  . Alcohol Use: No  . Drug Use: No  . Sexual Activity: Yes     Comment: last sex four weeks ago   Other Topics Concern  . Not on file   Social History Narrative  . No narrative on file   No current facility-administered medications on file prior to encounter.   Current Outpatient Prescriptions on File Prior to Encounter  Medication Sig Dispense Refill  . amoxicillin (AMOXIL) 250 MG capsule Take 1 capsule (250 mg total) by mouth 3 (three) times daily.  30 capsule  0  . metroNIDAZOLE (METROGEL) 0.75 % vaginal gel Place 1 Applicatorful vaginally at bedtime. Apply one applicatorful to vagina at bedtime for 5 days  70 g  1   Allergies  Allergen Reactions  . Tomato Other (See Comments)    acne    ROS: Pertinent items in HPI  OBJECTIVE Blood pressure 97/75, pulse 86, temperature 98.7 F (37.1 C), temperature source Oral, resp. rate 18, height 5\' 2"  (1.575 m), weight 65.046 kg (143 lb 6.4 oz), last menstrual period 08/21/2013, SpO2  100.00%. GENERAL: Well-developed, well-nourished female in no acute distress.  HEENT: Normocephalic HEART: normal rate RESP: normal effort ABDOMEN: Soft, non-tender EXTREMITIES: Nontender, no edema NEURO: Alert and oriented Pelvic exam: Cervix pink, visually closed, without lesion, large amount clear gel and white clumping discharge in vagina, no blood noted, vaginal walls and external genitalia normal Bimanual exam: Cervix 0/long/high, firm, anterior, neg CMT, uterus nontender, nonenlarged, adnexa without tenderness, enlargement, or mass  LAB RESULTS Results for orders placed during the hospital encounter of 10/01/13 (from the past 24 hour(s))  URINALYSIS, ROUTINE W REFLEX MICROSCOPIC     Status: Abnormal   Collection Time    10/01/13 10:05 AM      Result Value Ref Range   Color, Urine YELLOW  YELLOW   APPearance CLEAR  CLEAR   Specific Gravity, Urine 1.025  1.005 - 1.030   pH 6.0  5.0 - 8.0   Glucose, UA NEGATIVE  NEGATIVE mg/dL   Hgb urine dipstick NEGATIVE  NEGATIVE   Bilirubin Urine NEGATIVE  NEGATIVE   Ketones, ur 15 (*) NEGATIVE mg/dL   Protein, ur NEGATIVE  NEGATIVE mg/dL   Urobilinogen, UA 0.2  0.0 - 1.0 mg/dL   Nitrite NEGATIVE  NEGATIVE   Leukocytes, UA NEGATIVE  NEGATIVE  WET PREP, GENITAL     Status: Abnormal   Collection Time    10/01/13 10:33 AM  Result Value Ref Range   Yeast Wet Prep HPF POC NONE SEEN  NONE SEEN   Trich, Wet Prep NONE SEEN  NONE SEEN   Clue Cells Wet Prep HPF POC NONE SEEN  NONE SEEN   WBC, Wet Prep HPF POC FEW (*) NONE SEEN    IMAGING US Ob Transvaginal  10/01/2013   CLINICAL DATA:  Abdominal pain, spotting.  EXAM: TRANSVAGINAL OB ULTRASOUND  TECHNIQUE: Transvaginal ultrasound was performed for complete evaluation of the gestation as well as the maternal uterus, adnexal regions, and pelvic cul-de-sac.  COMPARISON:  09/24/2013  FINDINGS: Intrauterine gestational sac: Visualized/normal in shape.  Yolk sac:  Visualized  Embryo:  Not  visualized  Cardiac Activity: Not visualized  Heart Rate:  bpm  MSD: 6.4  mm   5 w   2  d  CRL:     mm    w  d                  [place] EDC: 06/01/2014  Maternal uterus/adnexae: No subchorionic hemorrhage. Ovaries are symmetric in size and echotexture. No adnexal mass.  IMPRESSION: Early intrauterine pregnancy. No fetal pole currently. Repeat ultrasound in 2 weeks may be beneficial to assess for expected progression.  No acute maternal findings.   Electronically Signed   By: Charlett Nose M.D.   On: 10/01/2013 11:48   US Ob Comp Less 14 Wks  09/24/2013   CLINICAL DATA:  Pelvic pain. Quantitative beta HCG is pending. Gravida 3 para 1. LMP 08/21/2013.  EXAM: OBSTETRIC <14 WK [place] AND TRANSVAGINAL OB US  TECHNIQUE: Both transabdominal and transvaginal ultrasound examinations were performed for complete evaluation of the gestation as well as the maternal uterus, adnexal regions, and pelvic cul-de-sac. Transvaginal technique was performed to assess early pregnancy.  COMPARISON:  None applicable.  FINDINGS: Intrauterine gestational sac: None  Yolk sac:  None  Embryo:  None  Cardiac Activity: None  Maternal uterus/adnexae: The ovaries have a normal appearance.  IMPRESSION: 1. No evidence for intrauterine or adnexal pregnancy. 2. Serial quantitative beta HCG values and follow-up ultrasound are recommended as appropriate to document progression of and location of pregnancy. Ectopic pregnancy has not been excluded.   Electronically Signed   By: Rosalie Gums M.D.   On: 09/24/2013 20:50    ASSESSMENT 1. Abdominal pain in pregnancy   2. Vaginal spotting     PLAN Discharge home F/U in [place] as planned for prenatal care Contact information given for [facility] if plan to stay in [place] Return to [facility]U as needed    Medication List         amoxicillin 250 MG capsule  Commonly known as:  AMOXIL  Take 1 capsule (250 mg total) by mouth 3 (three) times daily.     metroNIDAZOLE 0.75 % vaginal gel  Commonly known as:   METROGEL  Place 1 Applicatorful vaginally at bedtime. Apply one applicatorful to vagina at bedtime for 5 days       Follow-up Information   Schedule an appointment as soon as possible for a visit with [facility] [place]. (Or prenatal provider of your choice.  Return to MAU as needed.)    Contact information:   [address] [place] [address] [phone]      Sharen Counter Certified Nurse-Midwife 10/01/2013  2:10 PM

## 2013-10-01 NOTE — MAU Provider Note (Signed)
Attestation of Attending Supervision of Advanced Practitioner (CNM/NP): Evaluation and management procedures were performed by the Advanced Practitioner under my supervision and collaboration.  I have reviewed the Advanced Practitioner's note and chart, and I agree with the management and plan.  Messina Kosinski 10/01/2013 4:22 PM

## 2013-10-01 NOTE — MAU Note (Signed)
Patient states she has been having lower abdominal cramping that has continued since she was seen in MAU a week ago. States she had a yellow/red vaginal discharge. Some nausea, no vomiting.

## 2013-10-05 ENCOUNTER — Ambulatory Visit (HOSPITAL_COMMUNITY): Admission: RE | Admit: 2013-10-05 | Payer: Medicaid Other | Source: Ambulatory Visit

## 2013-11-02 ENCOUNTER — Encounter (HOSPITAL_COMMUNITY): Payer: Self-pay | Admitting: *Deleted

## 2013-11-02 ENCOUNTER — Inpatient Hospital Stay (HOSPITAL_COMMUNITY): Payer: Medicaid Other

## 2013-11-02 ENCOUNTER — Inpatient Hospital Stay (HOSPITAL_COMMUNITY)
Admission: AD | Admit: 2013-11-02 | Discharge: 2013-11-02 | Disposition: A | Discharge status: Home / Self Care | Payer: Medicaid Other | Source: Ambulatory Visit | Attending: Family Medicine | Admitting: Family Medicine

## 2013-11-02 DIAGNOSIS — O021 Missed abortion: Secondary | ICD-10-CM | POA: Insufficient documentation

## 2013-11-02 DIAGNOSIS — O039 Complete or unspecified spontaneous abortion without complication: Secondary | ICD-10-CM

## 2013-11-02 NOTE — MAU Note (Signed)
[place].  Read report over phone 7 week IUP , [facility], pt here for second opinion, to [place]

## 2013-11-02 NOTE — MAU Provider Note (Signed)
Attestation of Attending Supervision of Advanced Practitioner (PA/CNM/NP): Evaluation and management procedures were performed by the Advanced Practitioner under my supervision and collaboration.  I have reviewed the Advanced Practitioner's note and chart, and I agree with the management and plan.  Reva Boresanya S [place], MD [facility] Attending 11/02/2013 9:43 PM

## 2013-11-02 NOTE — MAU Provider Note (Signed)
History     CSN: [id number]  Arrival date and time: 11/02/13 1107   First Provider Initiated Contact with Patient 11/02/13 1623      No chief complaint on file.  HPI Megan Pena is 28 y.o. G2P0010 5381w3d weeks presenting with request for a second opinion regarding this pregnancy.  She was seen here on March 9 and 12 with appropriately rising BHCGs.  She began prenatal care at [facility] [place].  Was seen there the end of March for lab work, again yesterday--U/S was done and she was told the pregnancy was not viable and that it stopped growing around 7 weeks.  She is now seeing brown discharge.  She has a plan of care in place with Megan Pena--she was given option of Expected management or D&C and is to call them with her decision.  Because she is seeing brown discharge, it is her desire to have expected management with follow up.      Past Medical History  Diagnosis Date  . Herpes   . VQQVZDGL(875.6Headache(784.0)     Past Surgical History  Procedure Laterality Date  . Knee surgery      Family History  Problem Relation Age of Onset  . Cancer Mother   . Asthma Mother   . Cancer Father   . GER disease Brother     History  Substance Use Topics  . Smoking status: Never Smoker   . Smokeless tobacco: Not on file  . Alcohol Use: No    Allergies:  Allergies  Allergen Reactions  . Tomato Other (See Comments)    acne    Prescriptions prior to admission  Medication Sig Dispense Refill  . valACYclovir (VALTREX) 500 MG tablet Take 500 mg by mouth daily.        Review of Systems  Constitutional: Negative for fever.  Gastrointestinal: Negative for nausea, vomiting and abdominal pain.  Genitourinary:       Brown discharge  Neurological: Negative for headaches.   Physical Exam   Blood pressure 116/70, pulse 73, temperature 98.6 F (37 C), temperature source Oral, resp. rate 18, height 5' 1.5" (1.562 m), weight 144 lb 6.4 oz (65.499 kg), last menstrual period 08/21/2013, SpO2  100.00%.  Physical Exam  Constitutional: She is oriented to person, place, and time. She appears well-developed and well-nourished. No distress.  Genitourinary:  Not indicated.    Neurological: She is alert and oriented to person, place, and time.  Skin: Skin is warm and dry.  Psychiatric: She has a normal mood and affect. Her behavior is normal. Judgment and thought content normal.  [place] Ob Transvaginal  11/02/2013   CLINICAL DATA:  Missed abortion. By LMP the patient is 10 weeks 3 days. LMP was 08/21/2013. Gravida 2, ab 1.  EXAM: TRANSVAGINAL OB ULTRASOUND  TECHNIQUE: Transvaginal ultrasound was performed for complete evaluation of the gestation as well as the maternal uterus, adnexal regions, and pelvic cul-de-sac.  COMPARISON:  10/01/2013  FINDINGS: Intrauterine gestational sac: Present  Yolk sac:  Not seen  Embryo:  Present, anomalous appearing fetal pole.  Cardiac Activity: Not seen  CRL:   10.3  mm   7 w 1 d  Maternal uterus/adnexae: The ovaries have a normal appearance. No subchorionic hemorrhage identified. No free pelvic fluid.  IMPRESSION: 1. Intrauterine fetal demise. 2. Crown-rump length correlates with dating of 7 weeks 1 day.   Electronically Signed   By: Rosalie GumsBeth  Brown M.D.   On: 11/02/2013 16:56    MAU Course  Procedures  MDM   Assessment and Plan  A:  Missed abortion at 4451w1d gestation  --absent cardiac activity per U/S report         P:  Discussed U/S findings with the patient.       Continue [facility]        Megan Pena 11/02/2013, 4:27 PM

## 2013-11-02 NOTE — MAU Note (Signed)
Patient states [place] and was told they did not see a heart beat. Patient states she has felt pregnant but does not any more. Denies bleeding or pain. Patient wants second opinion about the fetal demise.

## 2013-11-02 NOTE — Discharge Instructions (Signed)
Incomplete Miscarriage °A miscarriage is the sudden loss of an unborn baby (fetus) before the 20th week of pregnancy. In an incomplete miscarriage, parts of the fetus or placenta (afterbirth) remain in the body.  °Having a miscarriage can be an emotional experience. Talk with your health care provider about any questions you may have about miscarrying, the grieving process, and your future pregnancy plans. °CAUSES  °· Problems with the fetal chromosomes that make it impossible for the baby to develop normally. Problems with the baby's genes or chromosomes are most often the result of errors that occur by chance as the embryo divides and grows. The problems are not inherited from the parents. °· Infection of the cervix or uterus. °· Hormone problems. °· Problems with the cervix, such as having an incompetent cervix. This is when the tissue in the cervix is not strong enough to hold the pregnancy. °· Problems with the uterus, such as an abnormally shaped uterus, uterine fibroids, or congenital abnormalities. °· Certain medical conditions. °· Smoking, drinking alcohol, or taking illegal drugs. °· Trauma. °SYMPTOMS  °· Vaginal bleeding or spotting, with or without cramps or pain. °· Pain or cramping in the abdomen or lower back. °· Passing fluid, tissue, or blood clots from the vagina. °DIAGNOSIS  °Your health care provider will perform a physical exam. You may also have an ultrasound to confirm the miscarriage. Blood or urine tests may also be ordered. °TREATMENT  °· Usually, a dilation and curettage (D&C) procedure is performed. During a D&C procedure, the cervix is widened (dilated) and any remaining fetal or placental tissue is gently removed from the uterus. °· Antibiotic medicines are prescribed if there is an infection. Other medicines may be given to reduce the size of the uterus (contract) if there is a lot of bleeding. °· If you have Rh negative blood and your baby was Rh positive, you will need an Rho(D)  immune globulin shot. This shot will protect any future baby from having Rh blood problems in future pregnancies. °· You may be confined to bed rest. This means you should stay in bed and only get up to use the bathroom. °HOME CARE INSTRUCTIONS  °· Rest as directed by your health care provider. °· Restrict activity as directed by your health care provider. You may be allowed to continue light activity if curettage was not done but you require further treatment. °· Keep track of the number of pads you use each day. Keep track of how soaked (saturated) they are. Record this information. °· Do not  use tampons. °· Do not douche or have sexual intercourse until approved by your health care provider. °· Keep all follow-up appointments for re-evaluation and continuing management. °· Only take over-the-counter or prescription medicines for pain, fever, or discomfort as directed by your health care provider. °· Take antibiotic medicine as directed by your health care provider. Make sure you finish it even if you start to feel better. °SEEK IMMEDIATE MEDICAL CARE IF:  °· You experience severe cramps in your stomach, back, or abdomen. °· You have an unexplained temperature (make sure to record these temperatures). °· You pass large clots or tissue (save these for your health care provider to inspect). °· Your bleeding increases. °· You become light-headed, weak, or have fainting episodes. °MAKE SURE YOU:  °· Understand these instructions. °· Will watch your condition. °· Will get help right away if you are not doing well or get worse. °Document Released: 07/05/2005 Document Revised: 04/25/2013 Document Reviewed: 02/01/2013 °  ExitCare® Patient Information ©2014 [employer]. ° °

## 2013-11-22 ENCOUNTER — Inpatient Hospital Stay (HOSPITAL_COMMUNITY)
Admission: AD | Admit: 2013-11-22 | Discharge: 2013-11-22 | Disposition: A | Discharge status: Home / Self Care | Payer: Medicaid Other | Source: Ambulatory Visit | Attending: Obstetrics and Gynecology | Admitting: Obstetrics and Gynecology

## 2013-11-22 ENCOUNTER — Encounter (HOSPITAL_COMMUNITY): Payer: Self-pay | Admitting: *Deleted

## 2013-11-22 DIAGNOSIS — R109 Unspecified abdominal pain: Secondary | ICD-10-CM | POA: Insufficient documentation

## 2013-11-22 DIAGNOSIS — N760 Acute vaginitis: Secondary | ICD-10-CM | POA: Insufficient documentation

## 2013-11-22 DIAGNOSIS — IMO0002 Reserved for concepts with insufficient information to code with codable children: Secondary | ICD-10-CM | POA: Insufficient documentation

## 2013-11-22 LAB — CBC
HCT: 34.2 % — ABNORMAL LOW (ref 36.0–46.0)
HEMOGLOBIN: 12.5 g/dL (ref 12.0–15.0)
MCH: 29.3 pg (ref 26.0–34.0)
MCHC: 36.5 g/dL — ABNORMAL HIGH (ref 30.0–36.0)
MCV: 80.1 fL (ref 78.0–100.0)
PLATELETS: 291 10*3/uL (ref 150–400)
RBC: 4.27 MIL/uL (ref 3.87–5.11)
RDW: 14.8 % (ref 11.5–15.5)
WBC: 7.4 10*3/uL (ref 4.0–10.5)

## 2013-11-22 LAB — WET PREP, GENITAL
Clue Cells Wet Prep HPF POC: NONE SEEN
TRICH WET PREP: NONE SEEN
Yeast Wet Prep HPF POC: NONE SEEN

## 2013-11-22 LAB — URINALYSIS, ROUTINE W REFLEX MICROSCOPIC
BILIRUBIN URINE: NEGATIVE
Glucose, UA: NEGATIVE mg/dL
HGB URINE DIPSTICK: NEGATIVE
KETONES UR: NEGATIVE mg/dL
Leukocytes, UA: NEGATIVE
NITRITE: NEGATIVE
Protein, ur: NEGATIVE mg/dL
SPECIFIC GRAVITY, URINE: 1.01 (ref 1.005–1.030)
Urobilinogen, UA: 0.2 mg/dL (ref 0.0–1.0)
pH: 6 (ref 5.0–8.0)

## 2013-11-22 MED ORDER — NYSTATIN-TRIAMCINOLONE 100000-0.1 UNIT/GM-% EX OINT: 1.0000 "application " | TOPICAL_OINTMENT | Freq: Three times a day (TID) | CUTANEOUS | Status: DC

## 2013-11-22 NOTE — MAU Provider Note (Signed)
Chief Complaint: No chief complaint on file.   First Provider Initiated Contact with Patient 11/22/13 2027     SUBJECTIVE HPI: Megan Pena is a 28 y.o. G2P0020 at 2 week S/P D&C for missed AB who presents with vulvar irritation and itching. Has been using pads and pantiliners since the D&C. Denies bleeding or vaginal discharge. Also reports abdominal bloating and makes it uncomfortable to sleep on her stomach. Mild, intermittent generalized abdominal pain. Has not resumed intercourse since D&C.  Past Medical History  Diagnosis Date  . Herpes   . Headache(784.0)    OB History  Gravida Para Term Preterm AB SAB TAB Ectopic Multiple Living  2    2 1  0   0    # Outcome Date GA Lbr Len/2nd Weight Sex Delivery Anes PTL Lv  2 SAB 2015             Comments: D&C for missed AB  1 ABT              Past Surgical History  Procedure Laterality Date  . Knee surgery    . Dilation and curettage of uterus     History   Social History  . Marital Status: Single    Spouse Name: N/A    Number of Children: N/A  . Years of Education: N/A   Occupational History  . Not on file.   Social History Main Topics  . Smoking status: Never Smoker   . Smokeless tobacco: Not on file  . Alcohol Use: No  . Drug Use: No  . Sexual Activity: Yes     Comment: last sex four weeks ago   Other Topics Concern  . Not on file   Social History Narrative  . No narrative on file   No current facility-administered medications on file prior to encounter.   Current Outpatient Prescriptions on File Prior to Encounter  Medication Sig Dispense Refill  . valACYclovir (VALTREX) 500 MG tablet Take 500 mg by mouth daily.       Allergies  Allergen Reactions  . Tomato Other (See Comments)    acne    ROS: Pertinent positive items in HPI. Normal appetite. Negative for fever, chills, nausea, vomiting, diarrhea, urinary complaints, flank pain, vaginal bleeding or vaginal discharge. Last BM today, normal. Mild  constipation in the few days after D&C, but normal since then.  OBJECTIVE Blood pressure 115/71, pulse 95, temperature 98.1 F (36.7 C), temperature source Oral, resp. rate 20, height 5\' 1"  (1.549 m), weight 65.375 kg (144 lb 2 oz), last menstrual period 08/21/2013, unknown if currently breastfeeding. GENERAL: Well-developed, well-nourished female in no acute distress.  HEENT: Normocephalic HEART: normal rate RESP: normal effort ABDOMEN: Mildly distended, non-tender. positive bowel sounds x4. Negative CVA tenderness. EXTREMITIES: Nontender, no edema NEURO: Alert and oriented PELVIC EXAM: NEFG except for moderate erythema and excoriation on bilateral lower labia majora, labia minora and crease labia and inner thigh, physiologic discharge, no blood noted, cervix closed; uterus normal size, no adnexal tenderness or masses. No cervical motion tenderness.  LAB RESULTS Results for orders placed during the hospital encounter of 11/22/13 (from the past 24 hour(s))  URINALYSIS, ROUTINE W REFLEX MICROSCOPIC     Status: None   Collection Time    11/22/13  6:51 PM      Result Value Ref Range   Color, Urine YELLOW  YELLOW   APPearance CLEAR  CLEAR   Specific Gravity, Urine 1.010  1.005 - 1.030   pH 6.0  5.0 - 8.0   Glucose, UA NEGATIVE  NEGATIVE mg/dL   Hgb urine dipstick NEGATIVE  NEGATIVE   Bilirubin Urine NEGATIVE  NEGATIVE   Ketones, ur NEGATIVE  NEGATIVE mg/dL   Protein, ur NEGATIVE  NEGATIVE mg/dL   Urobilinogen, UA 0.2  0.0 - 1.0 mg/dL   Nitrite NEGATIVE  NEGATIVE   Leukocytes, UA NEGATIVE  NEGATIVE  WET PREP, GENITAL     Status: Abnormal   Collection Time    11/22/13  8:40 PM      Result Value Ref Range   Yeast Wet Prep HPF POC NONE SEEN  NONE SEEN   Trich, Wet Prep NONE SEEN  NONE SEEN   Clue Cells Wet Prep HPF POC NONE SEEN  NONE SEEN   WBC, Wet Prep HPF POC FEW (*) NONE SEEN  CBC     Status: Abnormal   Collection Time    11/22/13  9:25 PM      Result Value Ref Range   WBC  7.4  4.0 - 10.5 K/uL   RBC 4.27  3.87 - 5.11 MIL/uL   Hemoglobin 12.5  12.0 - 15.0 g/dL   HCT 16.134.2 (*) 09.636.0 - 04.546.0 %   MCV 80.1  78.0 - 100.0 fL   MCH 29.3  26.0 - 34.0 pg   MCHC 36.5 (*) 30.0 - 36.0 g/dL   RDW 40.914.8  81.111.5 - 91.415.5 %   Platelets 291  150 - 400 K/uL    IMAGING N/A  MAU COURSE  ASSESSMENT 1. Vulvovaginitis     PLAN Discharge home in stable condition. Keep area dry. Do not wear panty liners pads unless you are bleeding. No intercourse until cleared by Ob/Gyn at F/U visit.      Follow-up Information   Follow up with Ob/Gyn. (if no improvement in 2-3 days. )       Follow [facility] with [facility] [place] [facility]. (As needed in emergencies)    Contact information:   [address] [id number] [place] [id number] [phone]       Medication List         HYDROcodone-acetaminophen 5-325 MG per tablet  Commonly known as:  NORCO/VICODIN  Take 1 tablet by mouth every 4 (four) hours as needed for moderate pain.     norgestimate-ethinyl estradiol 0.25-35 MG-MCG tablet  Commonly known as:  ORTHO-CYCLEN,SPRINTEC,PREVIFEM  Take 1 tablet by mouth daily.     nystatin-triamcinolone ointment  Commonly known as:  MYCOLOG  Apply 1 application topically 3 (three) times daily.     valACYclovir 500 MG tablet  Commonly known as:  VALTREX  Take 500 mg by mouth daily.        [place] Marifer Hurd, [place] 11/22/2013  10:16 PM

## 2013-11-22 NOTE — MAU Note (Signed)
SAYS SHE [facility]23-     [facility]   .  SHE THINKS  VAG INFECTION-  SAYS  SHE LOOKED WITH   PHONE  ON LABIA - RED,  AND LOOKS LIKE RAW SKIN ,  BURNS  WITH URINATION.  .  DENIES VAG D/C.   PT SAYS SHE WAS  HERE  ON 4-15-  IN PROCESS OF SAB  .     HER OB DR IS  IN [place].

## 2013-11-22 NOTE — Discharge Instructions (Signed)
Candidal Vulvovaginitis  Candidal vulvovaginitis is an infection of the vagina and vulva. The vulva is the skin around the opening of the vagina. This may cause itching and discomfort in and around the vagina.   HOME CARE  · Only take medicine as told by your doctor.  · Do not have sex (intercourse) until the infection is healed or as told by your doctor.  · Practice safe sex.  · Tell your sex partner about your infection.  · Do not douche or use tampons.  · Wear cotton underwear. Do not wear tight pants or panty hose.  · Eat yogurt. This may help treat and prevent yeast infections.  GET HELP RIGHT AWAY IF:   · You have a fever.  · Your problems get worse during treatment or do not get better in 3 days.  · You have discomfort, irritation, or itching in your vagina or vulva area.  · You have pain after sex.  · You start to get belly (abdominal) pain.  MAKE SURE YOU:  · Understand these instructions.  · Will watch your condition.  · Will get help right away if you are not doing well or get worse.  Document Released: 10/01/2008 Document Revised: 09/27/2011 Document Reviewed: 10/01/2008  ExitCare® Patient Information ©2014 [employer].

## 2013-11-23 NOTE — MAU Provider Note (Signed)
Attestation of Attending Supervision of Advanced Practitioner (CNM/NP): Evaluation and management procedures were performed by the Advanced Practitioner under my supervision and collaboration.  I have reviewed the Advanced Practitioner's note and chart, and I agree with the management and plan.  Carleen Rhue 11/23/2013 6:14 AM

## 2013-12-13 ENCOUNTER — Inpatient Hospital Stay (HOSPITAL_COMMUNITY)
Admission: AD | Admit: 2013-12-13 | Discharge: 2013-12-13 | Disposition: A | Discharge status: Home / Self Care | Payer: Medicaid Other | Source: Ambulatory Visit | Attending: Obstetrics & Gynecology | Admitting: Obstetrics & Gynecology

## 2013-12-13 DIAGNOSIS — G8918 Other acute postprocedural pain: Secondary | ICD-10-CM | POA: Insufficient documentation

## 2013-12-13 DIAGNOSIS — B009 Herpesviral infection, unspecified: Secondary | ICD-10-CM

## 2013-12-13 DIAGNOSIS — R109 Unspecified abdominal pain: Secondary | ICD-10-CM | POA: Insufficient documentation

## 2013-12-13 DIAGNOSIS — Z3009 Encounter for other general counseling and advice on contraception: Secondary | ICD-10-CM

## 2013-12-13 DIAGNOSIS — N760 Acute vaginitis: Secondary | ICD-10-CM | POA: Insufficient documentation

## 2013-12-13 DIAGNOSIS — A6000 Herpesviral infection of urogenital system, unspecified: Secondary | ICD-10-CM | POA: Insufficient documentation

## 2013-12-13 LAB — CBC
HCT: 34.2 % — ABNORMAL LOW (ref 36.0–46.0)
Hemoglobin: 12.6 g/dL (ref 12.0–15.0)
MCH: 30 pg (ref 26.0–34.0)
MCHC: 36.8 g/dL — ABNORMAL HIGH (ref 30.0–36.0)
MCV: 81.4 fL (ref 78.0–100.0)
PLATELETS: 273 10*3/uL (ref 150–400)
RBC: 4.2 MIL/uL (ref 3.87–5.11)
RDW: 14.4 % (ref 11.5–15.5)
WBC: 4 10*3/uL (ref 4.0–10.5)

## 2013-12-13 LAB — URINE MICROSCOPIC-ADD ON

## 2013-12-13 LAB — URINALYSIS, ROUTINE W REFLEX MICROSCOPIC
Bilirubin Urine: NEGATIVE
GLUCOSE, UA: NEGATIVE mg/dL
Ketones, ur: NEGATIVE mg/dL
LEUKOCYTES UA: NEGATIVE
Nitrite: NEGATIVE
PH: 6 (ref 5.0–8.0)
Protein, ur: NEGATIVE mg/dL
SPECIFIC GRAVITY, URINE: 1.02 (ref 1.005–1.030)
Urobilinogen, UA: 0.2 mg/dL (ref 0.0–1.0)

## 2013-12-13 LAB — HCG, QUANTITATIVE, PREGNANCY: hCG, Beta Chain, Quant, S: 2 m[IU]/mL (ref <5)

## 2013-12-13 MED ORDER — NORGESTIMATE-ETH ESTRADIOL 0.25-35 MG-MCG PO TABS: 1.0000 | ORAL_TABLET | Freq: Every day | ORAL | Status: DC

## 2013-12-13 MED ORDER — VALACYCLOVIR HCL 500 MG PO TABS: 500.0000 mg | ORAL_TABLET | Freq: Two times a day (BID) | ORAL | Status: DC

## 2013-12-13 NOTE — MAU Provider Note (Signed)
Attestation of Attending Supervision of Advanced Practitioner (CNM/NP): Evaluation and management procedures were performed by the Advanced Practitioner under my supervision and collaboration.  I have reviewed the Advanced Practitioner's note and chart, and I agree with the management and plan.  Megan Pena 4:34 PM

## 2013-12-13 NOTE — MAU Note (Signed)
Pt had D&C on 11-08-13 for retained products after miscarriage by Dr. Mins in [facility].  Pt still is having abd crampy pain rating 5 of 10 today and states, "I still feel like something is in there".  Reports Nausea but no vomiting.  Pt was seen here on 11-22-13 for vaginitis.  Received a cream to apply to the perineum.  Pt still having Raw itchy skin even after using the cream.  Pt is suppose to go back for 6 week check up on Tues. In [place] but lost job and not able to make it to appt.

## 2013-12-13 NOTE — MAU Provider Note (Signed)
Chief Complaint: Abdominal Pain and Vaginal Discharge   First Provider Initiated Contact with Patient 12/13/13 1517     SUBJECTIVE HPI: Megan Pena is a 28 y.o. G2P0020 pt who is 3 weeks s/p D&C for miscarriage who presents to maternity admissions reporting abdominal pain when she tries to sleep on her abdomen and some occasional spotting since the procedure.  She did [place] because she lost her job and cannot pay for services at this time.  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  She reports she is having some itching and what feels to her like she might be starting an HSV outbreak.  She has 3-4 outbreaks/year related to stress.  She started OCPs 2 weeks following her procedure and has not yet finished her first pack.  She reports good fetal movement, denies LOF, vaginal bleeding, vaginal itching/burning, urinary symptoms, h/a, dizziness, n/v, or fever/chills.     Past Medical History  Diagnosis Date  . Herpes   . ZOXWRUEA(540.9Headache(784.0)    Past Surgical History  Procedure Laterality Date  . Knee surgery    . Dilation and curettage of uterus     History   Social History  . Marital Status: Single    Spouse Name: N/A    Number of Children: N/A  . Years of Education: N/A   Occupational History  . Not on file.   Social History Main Topics  . Smoking status: Never Smoker   . Smokeless tobacco: Not on file  . Alcohol Use: No  . Drug Use: No  . Sexual Activity: Yes     Comment: last sex four weeks ago   Other Topics Concern  . Not on file   Social History Narrative  . No narrative on file   No current facility-administered medications on file prior to encounter.   Current Outpatient Prescriptions on File Prior to Encounter  Medication Sig Dispense Refill  . nystatin-triamcinolone ointment (MYCOLOG) Apply 1 application topically 3 (three) times daily.  30 g  0   Allergies  Allergen Reactions  . Tomato  Other (See Comments)    acne    ROS: Pertinent items in HPI  OBJECTIVE Blood pressure 122/79, pulse 76, temperature 98.6 F (37 C), temperature source Oral, resp. rate 18, height 5' 1.5" (1.562 m), weight 64.592 kg (142 lb 6.4 oz), last menstrual period 08/21/2013, SpO2 98.00%, unknown if currently breastfeeding. GENERAL: Well-developed, well-nourished female in no acute distress.  HEENT: Normocephalic HEART: normal rate RESP: normal effort ABDOMEN: Soft, non-tender EXTREMITIES: Nontender, no edema NEURO: Alert and oriented SPECULUM EXAM: Deferred--pt declined today   LAB RESULTS Results for orders placed during the hospital encounter of 12/13/13 (from the past 24 hour(s))  URINALYSIS, ROUTINE W REFLEX MICROSCOPIC     Status: Abnormal   Collection Time    12/13/13 12:30 PM      Result Value Ref Range   Color, Urine YELLOW  YELLOW   APPearance CLEAR  CLEAR   Specific Gravity, Urine 1.020  1.005 - 1.030   pH 6.0  5.0 - 8.0   Glucose, UA NEGATIVE  NEGATIVE mg/dL   Hgb urine dipstick TRACE (*) NEGATIVE   Bilirubin Urine NEGATIVE  NEGATIVE   Ketones, ur NEGATIVE  NEGATIVE mg/dL   Protein, ur NEGATIVE  NEGATIVE mg/dL   Urobilinogen, UA 0.2  0.0 - 1.0 mg/dL   Nitrite NEGATIVE  NEGATIVE   Leukocytes, UA NEGATIVE  NEGATIVE  URINE MICROSCOPIC-ADD ON     Status: Abnormal   Collection Time    12/13/13 12:30 PM      Result Value Ref Range   Squamous Epithelial / LPF MANY (*) RARE   WBC, UA 0-2  <3 WBC/hpf   RBC / HPF 0-2  <3 RBC/hpf   Bacteria, UA MANY (*) RARE  HCG, QUANTITATIVE, PREGNANCY     Status: None   Collection Time    12/13/13  1:22 PM      Result Value Ref Range   hCG, Beta Chain, Quant, S 2  <5 mIU/mL  CBC     Status: Abnormal   Collection Time    12/13/13  1:22 PM      Result Value Ref Range   WBC 4.0  4.0 - 10.5 K/uL   RBC 4.20  3.87 - 5.11 MIL/uL   Hemoglobin 12.6  12.0 - 15.0 g/dL   HCT 43.3 (*) 29.5 - 18.8 %   MCV 81.4  78.0 - 100.0 fL   MCH 30.0  26.0 -  34.0 pg   MCHC 36.8 (*) 30.0 - 36.0 g/dL   RDW 41.6  60.6 - 30.1 %   Platelets 273  150 - 400 K/uL     ASSESSMENT 1. Recurrent HSV (herpes simplex virus)   2. Postoperative pain   3. Counseling for birth control, oral contraceptives   4. Vulvovaginitis     PLAN Discharge home Discussed quant hcg results with pt Pt to continue OCPs--Rx renewed.  Discussion of contraception, including LARCs with pt and questions answered.  Valtrex Rx for 5 day treatment with refills  F/U appt made in clinic to discuss cramping/pain and for contraceptive management    Medication List         norgestimate-ethinyl estradiol 0.25-35 MG-MCG tablet  Commonly known as:  ORTHO-CYCLEN,SPRINTEC,PREVIFEM  Take 1 tablet by mouth daily.     nystatin-triamcinolone ointment  Commonly known as:  MYCOLOG  Apply 1 application topically 3 (three) times daily.     valACYclovir 500 MG tablet  Commonly known as:  VALTREX  Take 1 tablet (500 mg total) by mouth 2 (two) times daily.       Follow-up Information   Follow up with [facility].   Specialty:  Obstetrics and Gynecology   Contact information:   [address] [place] [address] [phone]      Follow up with [facility] [place] MATERNITY ADMISSIONS. (As needed for emergencies)    Contact information:   [address] [id number] [place] [address] [phone]      Sharen Counter Certified Nurse-Midwife 12/13/2013  4:08 PM

## 2013-12-13 NOTE — MAU Note (Signed)
Not in the lobby when called to a room.

## 2013-12-13 NOTE — Discharge Instructions (Signed)
Dilation and Curettage or Vacuum Curettage, Care After  Refer to this sheet in the next few weeks. These instructions provide you with information on caring for yourself after your procedure. Your health care provider may also give you more specific instructions. Your treatment has been planned according to current medical practices, but problems sometimes occur. Call your health care provider if you have any problems or questions after your procedure.  WHAT TO EXPECT AFTER THE PROCEDURE  After your procedure, it is typical to have light cramping and bleeding. This may last for 2 days to 2 weeks after the procedure.  HOME CARE INSTRUCTIONS   · Do not drive for 24 hours.  · Wait 1 week before returning to strenuous activities.  · Take your temperature 2 times a day for 4 days and write it down. Provide these temperatures to your health care provider if you develop a fever.  · Avoid long periods of standing.  · Avoid heavy lifting, pushing, or pulling. Do not lift anything heavier than 10 pounds (4.5 kg).  · Limit stair climbing to once or twice a day.  · Take rest periods often.  · You may resume your usual diet.  · Drink enough fluids to keep your urine clear or pale yellow.  · Your usual bowel function should return. If you have constipation, you may:  · Take a mild laxative with permission from your health care provider.  · Add fruit and bran to your diet.  · Drink more fluids.  · Take showers instead of baths until your health care provider gives you permission to take baths.  · Do not go swimming or use a hot tub until your health care provider approves.  · Try to have someone with you or available to you the first 24 48 hours, especially if you were given a general anesthetic.  · Do not douche, use tampons, or have intercourse for 2 weeks after the procedure.  · Only take over-the-counter or prescription medicines as directed by your health care provider. Do not take aspirin. It can cause bleeding.  · Follow up  with your health care provider as directed.  SEEK MEDICAL CARE IF:   · You have increasing cramps or pain that is not relieved with medicine.  · You have abdominal pain that does not seem to be related to the same area of earlier cramping and pain.  · You have bad smelling vaginal discharge.  · You have a rash.  · You are having problems with any medicine.  SEEK IMMEDIATE MEDICAL CARE IF:   · You have bleeding that is heavier than a normal menstrual period.  · You have a fever.  · You have chest pain.  · You have shortness of breath.  · You feel dizzy or feel like fainting.  · You pass out.  · You have pain in your shoulder strap area.  · You have heavy vaginal bleeding with or without blood clots.  Document Released: 07/02/2000 Document Revised: 04/25/2013 Document Reviewed: 02/01/2013  ExitCare® Patient Information ©2014 [employer].

## 2013-12-17 ENCOUNTER — Encounter: Payer: Self-pay | Admitting: Nurse Practitioner

## 2014-01-16 ENCOUNTER — Encounter (HOSPITAL_COMMUNITY): Payer: Self-pay | Admitting: *Deleted

## 2014-01-16 ENCOUNTER — Inpatient Hospital Stay (HOSPITAL_COMMUNITY)
Admission: AD | Admit: 2014-01-16 | Discharge: 2014-01-16 | Disposition: A | Discharge status: Home / Self Care | Payer: Medicaid Other | Source: Ambulatory Visit | Attending: Family Medicine | Admitting: Family Medicine

## 2014-01-16 DIAGNOSIS — Z3202 Encounter for pregnancy test, result negative: Secondary | ICD-10-CM | POA: Insufficient documentation

## 2014-01-16 DIAGNOSIS — N719 Inflammatory disease of uterus, unspecified: Secondary | ICD-10-CM | POA: Insufficient documentation

## 2014-01-16 DIAGNOSIS — O035 Genital tract and pelvic infection following complete or unspecified spontaneous abortion: Secondary | ICD-10-CM

## 2014-01-16 LAB — POCT PREGNANCY, URINE: Preg Test, Ur: NEGATIVE

## 2014-01-16 MED ORDER — DOXYCYCLINE HYCLATE 100 MG PO CAPS: 100.0000 mg | ORAL_CAPSULE | Freq: Two times a day (BID) | ORAL | Status: DC

## 2014-01-16 NOTE — MAU Note (Signed)
Patient states she had a SAB in April and has not had a period since that time. Wants to have a pregnancy test. Denies pain, bleeding, discharge, nausea or vomiting.

## 2014-01-16 NOTE — MAU Provider Note (Signed)
History     CSN: [id number]  Arrival date and time: 01/16/14 1759   First Provider Initiated Contact with Patient 01/16/14 1851      Chief Complaint  Patient presents with  . Possible Pregnancy   HPI Comments: Megan Pena 28 y.o. G2P0020 presents to MAU with uterus discomfort/ pains for 2 months. It comes and goes. She cant sleep on her stomach and its tender to bend over. She had a D&C in April following an SAB. She is taking NSAIDsOTC every 4 hours prn. Her pain can be "8" at times. She has an [facility] at end [facility] [place]. No change in sexual partner and pain does not occur with intercourse. She was on BCPs for one month and stopped. She is using condoms 100% time.    Possible Pregnancy      Past Medical History  Diagnosis Date  . Herpes   . XBJYNWGN(562.1Headache(784.0)     Past Surgical History  Procedure Laterality Date  . Knee surgery    . Dilation and curettage of uterus      Family History  Problem Relation Age of Onset  . Cancer Mother   . Asthma Mother   . Cancer Father   . GER disease Brother     History  Substance Use Topics  . Smoking status: Never Smoker   . Smokeless tobacco: Not on file  . Alcohol Use: No    Allergies:  Allergies  Allergen Reactions  . Tomato Other (See Comments)    acne    Prescriptions prior to admission  Medication Sig Dispense Refill  . norgestimate-ethinyl estradiol (ORTHO-CYCLEN,SPRINTEC,PREVIFEM) 0.25-35 MG-MCG tablet Take 1 tablet by mouth daily.  1 Package  11  . nystatin-triamcinolone ointment (MYCOLOG) Apply 1 application topically 3 (three) times daily.  30 g  0  . valACYclovir (VALTREX) 500 MG tablet Take 1 tablet (500 mg total) by mouth 2 (two) times daily.  10 tablet  5    Review of Systems  Constitutional: Negative.   Eyes: Negative.   Respiratory: Negative.   Cardiovascular: Negative.   Gastrointestinal: Negative.   Genitourinary:       Pelvic pains   Musculoskeletal: Negative.   Skin: Negative.   Neurological: Negative.   Psychiatric/Behavioral: Negative.    Physical Exam   Blood pressure 120/76, pulse 80, temperature 98.4 F (36.9 C), temperature source Oral, resp. rate 16, height 5' 1.5" (1.562 m), weight 64.32 kg (141 lb 12.8 oz), SpO2 99.00%, unknown if currently breastfeeding.  Physical Exam  Constitutional: She is oriented to person, place, and time. She appears well-developed and well-nourished.  HENT:  Head: Normocephalic and atraumatic.  Eyes: Pupils are equal, round, and reactive to light.  GI: Soft. Bowel sounds are normal. She exhibits no distension. There is no tenderness. There is no rebound and no guarding.  Genitourinary:  Genital:External negative Vaginal:small amount thin white discharge Cervix:closed/ thick Bimanual: uterus tender   Musculoskeletal: Normal range of motion.  Neurological: She is alert and oriented to person, place, and time.  Skin: Skin is warm and dry.  Psychiatric: She has a normal mood and affect. Her behavior is normal. Judgment and thought content normal.    MAU Course  Procedures  MDM Spoke with Dr Shawnie PonsPratt who advised Doxycycline for 10 days  Assessment and Plan  A: Endometritis  P: Doxycycline 100 mg PO BID x 10 days Continue NSAIDs as needed [place]   Bridney Guadarrama,  Rubbie BattiestLinda Miller 01/16/2014, 7:46 PM

## 2014-01-16 NOTE — Discharge Instructions (Signed)
Endometritis Endometritis is an irritation, soreness, and swelling (inflammation) of the lining of the uterus (endometrium).  CAUSES   Bacterial infections.  Sexually transmitted infections (STIs).  Having a miscarriage or childbirth, especially after a long labor or cesarean delivery.  Certain gynecological procedures (such as dilation and curettage, hysteroscopy, or contraceptive insertion). SIGNS AND SYMPTOMS   Fever.  Lower abdominal or pelvic pain.  Abnormal vaginal discharge or bleeding.  Abdominal bloating (distention) or swelling.  General discomfort or ill feeling.  Discomfort with bowel movements. DIAGNOSIS  A physical and pelvic exam are performed. Other tests may include:  Cultures from the cervix.  Blood tests.  Examining a tissue sample of the uterine lining (endometrial biopsy).  Examining discharge under a microscope (wet prep).  Laparoscopy. TREATMENT  Antibiotic medicines are usually given. Other treatments may include:  Fluids through an IV tube inserted in your vein.  Rest. HOME CARE INSTRUCTIONS   Take over-the-counter or prescription medicines for pain, discomfort, or fever as directed by your health care provider.  Take your antibiotics as directed. Finish them even if you start to feel better.  Resume your normal diet and activities as directed or as tolerated.  Do not douche or have sexual intercourse until your health care provider says it is okay.  Do not have sexual intercourse until your partner has been treated if your endometritis is caused by an STI. SEEK IMMEDIATE MEDICAL CARE IF:   You have swelling or increasing pain in the abdomen.  You have a fever.  You have bad smelling vaginal discharge, or you have an increased amount of discharge.  You have abnormal vaginal bleeding.  Your medicine is not helping with the pain.  You experience any problems that may be related to the medicine you are taking.  You have nausea  and vomiting, or you cannot keep foods down.  You have pain with bowel movements. MAKE SURE YOU:   Understand these instructions.  Will watch your condition.  Will get help right away if you are not doing well or get worse. Document Released: 06/29/2001 Document Revised: 03/07/2013 Document Reviewed: 02/01/2013 ExitCare Patient Information 2015 [employer]. This information is not intended to replace advice given to you by your health care provider. Make sure you discuss any questions you have with your health care provider.  

## 2014-01-17 NOTE — MAU Provider Note (Signed)
Attestation of Attending Supervision of Advanced Practitioner (PA/CNM/NP): Evaluation and management procedures were performed by the Advanced Practitioner under my supervision and collaboration.  I have reviewed the Advanced Practitioner's note and chart, and I agree with the management and plan.  Reva BoresPRATT,TANYA S, MD [facility] Attending 01/17/2014 12:54 AM

## 2014-02-08 ENCOUNTER — Encounter: Payer: Medicaid Other | Admitting: Nurse Practitioner

## 2014-05-20 ENCOUNTER — Encounter (HOSPITAL_COMMUNITY): Payer: Self-pay | Admitting: *Deleted

## 2014-06-06 ENCOUNTER — Inpatient Hospital Stay (HOSPITAL_COMMUNITY): Payer: Self-pay

## 2014-06-06 ENCOUNTER — Inpatient Hospital Stay (HOSPITAL_COMMUNITY)
Admission: AD | Admit: 2014-06-06 | Discharge: 2014-06-06 | Disposition: A | Discharge status: Home / Self Care | Payer: Self-pay | Source: Ambulatory Visit | Attending: Obstetrics & Gynecology | Admitting: Obstetrics & Gynecology

## 2014-06-06 ENCOUNTER — Encounter (HOSPITAL_COMMUNITY): Payer: Self-pay | Admitting: *Deleted

## 2014-06-06 DIAGNOSIS — N730 Acute parametritis and pelvic cellulitis: Secondary | ICD-10-CM

## 2014-06-06 DIAGNOSIS — N739 Female pelvic inflammatory disease, unspecified: Secondary | ICD-10-CM | POA: Insufficient documentation

## 2014-06-06 DIAGNOSIS — B9689 Other specified bacterial agents as the cause of diseases classified elsewhere: Secondary | ICD-10-CM | POA: Insufficient documentation

## 2014-06-06 DIAGNOSIS — R102 Pelvic and perineal pain: Secondary | ICD-10-CM

## 2014-06-06 LAB — URINALYSIS, ROUTINE W REFLEX MICROSCOPIC
BILIRUBIN URINE: NEGATIVE
Glucose, UA: NEGATIVE mg/dL
HGB URINE DIPSTICK: NEGATIVE
Ketones, ur: NEGATIVE mg/dL
Leukocytes, UA: NEGATIVE
Nitrite: NEGATIVE
Protein, ur: NEGATIVE mg/dL
Urobilinogen, UA: 0.2 mg/dL (ref 0.0–1.0)
pH: 6.5 (ref 5.0–8.0)

## 2014-06-06 LAB — COMPREHENSIVE METABOLIC PANEL
ALK PHOS: 79 U/L (ref 39–117)
ALT: 11 U/L (ref 0–35)
ANION GAP: 10 (ref 5–15)
AST: 14 U/L (ref 0–37)
Albumin: 3.3 g/dL — ABNORMAL LOW (ref 3.5–5.2)
BILIRUBIN TOTAL: 0.3 mg/dL (ref 0.3–1.2)
BUN: 14 mg/dL (ref 6–23)
CHLORIDE: 104 meq/L (ref 96–112)
CO2: 27 mEq/L (ref 19–32)
Calcium: 9 mg/dL (ref 8.4–10.5)
Creatinine, Ser: 0.7 mg/dL (ref 0.50–1.10)
GFR calc Af Amer: >90 mL/min (ref >90)
GFR calc non Af Amer: >90 mL/min (ref >90)
Glucose, Bld: 85 mg/dL (ref 70–99)
POTASSIUM: 3.8 meq/L (ref 3.7–5.3)
Sodium: 141 mEq/L (ref 137–147)
Total Protein: 6.6 g/dL (ref 6.0–8.3)

## 2014-06-06 LAB — CBC
HCT: 34.7 % — ABNORMAL LOW (ref 36.0–46.0)
Hemoglobin: 12.6 g/dL (ref 12.0–15.0)
MCH: 29.6 pg (ref 26.0–34.0)
MCHC: 36.3 g/dL — AB (ref 30.0–36.0)
MCV: 81.6 fL (ref 78.0–100.0)
PLATELETS: 280 10*3/uL (ref 150–400)
RBC: 4.25 MIL/uL (ref 3.87–5.11)
RDW: 13.5 % (ref 11.5–15.5)
WBC: 5.7 10*3/uL (ref 4.0–10.5)

## 2014-06-06 LAB — WET PREP, GENITAL
CLUE CELLS WET PREP: NONE SEEN
TRICH WET PREP: NONE SEEN
Yeast Wet Prep HPF POC: NONE SEEN

## 2014-06-06 LAB — POCT PREGNANCY, URINE: Preg Test, Ur: NEGATIVE

## 2014-06-06 MED ORDER — DOXYCYCLINE HYCLATE 100 MG PO TABS
100.0000 mg | ORAL_TABLET | Freq: Once | ORAL | Status: AC
  Administered 2014-06-06: 100 mg via ORAL
  Filled 2014-06-06: qty 1

## 2014-06-06 MED ORDER — DOXYCYCLINE HYCLATE 100 MG PO TABS: 100.0000 mg | ORAL_TABLET | Freq: Two times a day (BID) | ORAL | Status: DC

## 2014-06-06 MED ORDER — CEFTRIAXONE SODIUM 250 MG IJ SOLR
250.0000 mg | Freq: Once | INTRAMUSCULAR | Status: AC
  Administered 2014-06-06: 250 mg via INTRAMUSCULAR
  Filled 2014-06-06: qty 250

## 2014-06-06 NOTE — MAU Provider Note (Signed)
History     CSN: [id number]  Arrival date and time: 06/06/14 1315   First Provider Initiated Contact with Patient 06/06/14 1459      Chief Complaint  Patient presents with  . Breast Pain  . Abdominal Pain   HPI  Megan Pena is a 28 y.o. G2P0020 who presents today with breast pain for one month. She states that she has sharp shooting pain for about a month. She states that she drinks caffeine about once a week.   She is also having abdominal tenderness since her D&C. She states that sleeping on her abdomen is uncomfortable and when she bumps into things she notices the pain. Her LMP was 05/28/14. She denies any nausea/vomiting/diarrhea or fever. She denies any pain with urination.   Past Medical History  Diagnosis Date  . Herpes   . WUJWJXBJ(478.2Headache(784.0)     Past Surgical History  Procedure Laterality Date  . Knee surgery    . Dilation and curettage of uterus      Family History  Problem Relation Age of Onset  . Cancer Mother   . Asthma Mother   . Cancer Father   . GER disease Brother     History  Substance Use Topics  . Smoking status: Never Smoker   . Smokeless tobacco: Not on file  . Alcohol Use: No    Allergies:  Allergies  Allergen Reactions  . Tomato Other (See Comments)    acne    Prescriptions prior to admission  Medication Sig Dispense Refill Last Dose  . doxycycline (VIBRAMYCIN) 100 MG capsule Take 1 capsule (100 mg total) by mouth 2 (two) times daily. (Patient not taking: Reported on 06/06/2014) 20 capsule 0   . norgestimate-ethinyl estradiol (ORTHO-CYCLEN,SPRINTEC,PREVIFEM) 0.25-35 MG-MCG tablet Take 1 tablet by mouth daily. (Patient not taking: Reported on 06/06/2014) 1 Package 11 Has not started  . nystatin-triamcinolone ointment (MYCOLOG) Apply 1 application topically 3 (three) times daily. (Patient not taking: Reported on 06/06/2014) 30 g 0 Past Week at Unknown time  . valACYclovir (VALTREX) 500 MG tablet Take 1 tablet (500 mg total) by mouth 2  (two) times daily. (Patient not taking: Reported on 06/06/2014) 10 tablet 5 Has not started    ROS Physical Exam   Blood pressure 122/72, pulse 88, temperature 98.3 F (36.8 C), temperature source Oral, resp. rate 18, height 5\' 2"  (1.575 m), weight 67.949 kg (149 lb 12.8 oz), unknown if currently breastfeeding.  Physical Exam  Nursing note and vitals reviewed. Constitutional: She is oriented to person, place, and time. She appears well-developed and well-nourished. No distress.  Cardiovascular: Normal rate.   Respiratory: Effort normal.  GI: Soft. There is no tenderness. There is no rebound.  Genitourinary:   External: small herpatic appearing sore at the introitus  Vagina: moderate amount of thick, white discharge Cervix: pink, smooth, +CMT  Uterus: NSSC Adnexa: NT   Neurological: She is alert and oriented to person, place, and time.  Skin: Skin is warm and dry.  Psychiatric: She has a normal mood and affect.    MAU Course  Procedures  Results for orders placed or performed during the hospital encounter of 06/06/14 (from the past 24 hour(s))  Urinalysis, Routine w reflex microscopic     Status: Abnormal   Collection Time: 06/06/14  1:25 PM  Result Value Ref Range   Color, Urine STRAW (A) YELLOW   APPearance CLEAR CLEAR   Specific Gravity, Urine <1.005 (L) 1.005 - 1.030   pH 6.5 5.0 - 8.0  Glucose, UA NEGATIVE NEGATIVE mg/dL   Hgb urine dipstick NEGATIVE NEGATIVE   Bilirubin Urine NEGATIVE NEGATIVE   Ketones, ur NEGATIVE NEGATIVE mg/dL   Protein, ur NEGATIVE NEGATIVE mg/dL   Urobilinogen, UA 0.2 0.0 - 1.0 mg/dL   Nitrite NEGATIVE NEGATIVE   Leukocytes, UA NEGATIVE NEGATIVE  Pregnancy, urine POC     Status: None   Collection Time: 06/06/14  1:41 PM  Result Value Ref Range   Preg Test, Ur NEGATIVE NEGATIVE  CBC     Status: Abnormal   Collection Time: 06/06/14  3:09 PM  Result Value Ref Range   WBC 5.7 4.0 - 10.5 K/uL   RBC 4.25 3.87 - 5.11 MIL/uL   Hemoglobin  12.6 12.0 - 15.0 g/dL   HCT 69.6 (L) 29.5 - 28.4 %   MCV 81.6 78.0 - 100.0 fL   MCH 29.6 26.0 - 34.0 pg   MCHC 36.3 (H) 30.0 - 36.0 g/dL   RDW 13.2 44.0 - 10.2 %   Platelets 280 150 - 400 K/uL  Wet prep, genital     Status: Abnormal   Collection Time: 06/06/14  3:10 PM  Result Value Ref Range   Yeast Wet Prep HPF POC NONE SEEN NONE SEEN   Trich, Wet Prep NONE SEEN NONE SEEN   Clue Cells Wet Prep HPF POC NONE SEEN NONE SEEN   WBC, Wet Prep HPF POC MODERATE (A) NONE SEEN   US Transvaginal Non-ob  06/06/2014   CLINICAL DATA:  Pelvic pain since D&C in April  EXAM: TRANSABDOMINAL AND TRANSVAGINAL ULTRASOUND OF PELVIS  TECHNIQUE: Both transabdominal and transvaginal ultrasound examinations of the pelvis were performed. Transabdominal technique was performed for global imaging of the pelvis including uterus, ovaries, adnexal regions, and pelvic cul-de-sac. It was necessary to proceed with endovaginal exam following the transabdominal exam to visualize the bilateral ovaries.  COMPARISON:  None  FINDINGS: Uterus  Measurements: 7.2 x 3.1 x 3.8 cm. No fibroids or other mass visualized.  Endometrium  Thickness: 3 mm.  No focal abnormality visualized.  Right ovary  Measurements: 3.6 x 1.7 x 1.8 cm. Normal appearance/no adnexal mass.  Left ovary  Measurements: 2.5 x 2.8 x 2.1 cm. Normal appearance/no adnexal mass.  Other findings  No free fluid.  IMPRESSION: Negative pelvic ultrasound.   Electronically Signed   By: Charline Bills M.D.   On: 06/06/2014 16:00   US Pelvis Complete  06/06/2014   CLINICAL DATA:  Pelvic pain since D&C in April  EXAM: TRANSABDOMINAL AND TRANSVAGINAL ULTRASOUND OF PELVIS  TECHNIQUE: Both transabdominal and transvaginal ultrasound examinations of the pelvis were performed. Transabdominal technique was performed for global imaging of the pelvis including uterus, ovaries, adnexal regions, and pelvic cul-de-sac. It was necessary to proceed with endovaginal exam following the  transabdominal exam to visualize the bilateral ovaries.  COMPARISON:  None  FINDINGS: Uterus  Measurements: 7.2 x 3.1 x 3.8 cm. No fibroids or other mass visualized.  Endometrium  Thickness: 3 mm.  No focal abnormality visualized.  Right ovary  Measurements: 3.6 x 1.7 x 1.8 cm. Normal appearance/no adnexal mass.  Left ovary  Measurements: 2.5 x 2.8 x 2.1 cm. Normal appearance/no adnexal mass.  Other findings  No free fluid.  IMPRESSION: Negative pelvic ultrasound.   Electronically Signed   By: Charline Bills M.D.   On: 06/06/2014 16:00   1608: D/W Dr. Macon Large, ok for dc home with 14 days of doxycycline and 250mg  Rocephin IM here. No FU needed with normal WBC.  Assessment and Plan   1. PID (acute pelvic inflammatory disease)   2. Pelvic pain in female    Rocephin given here in MAU and first dose of doxycycline RX: doxycycline 100mg  BID x 14 days, #27  Follow-up Information    [facility] [place].   Why:  If symptoms worsen   Contact information:   [address]  [place] [phone]       Tawnya CrookHogan, Calogero Geisen Donovan 06/06/2014, 3:00 PM

## 2014-06-06 NOTE — MAU Note (Signed)
Pt reports she has had had problems/pain in her breasts and abd off and on since her d&C in April. Has not been able to see her OBGYN due to insurance coverage.

## 2014-06-06 NOTE — Discharge Instructions (Signed)
Pelvic Inflammatory Disease °Pelvic inflammatory disease (PID) refers to an infection in some or all of the female organs. The infection can be in the uterus, ovaries, fallopian tubes, or the surrounding tissues in the pelvis. PID can cause abdominal or pelvic pain that comes on suddenly (acute pelvic pain). PID is a serious infection because it can lead to lasting (chronic) pelvic pain or the inability to have children (infertile).  °CAUSES  °The infection is often caused by the normal bacteria found in the vaginal tissues. PID may also be caused by an infection that is spread during sexual contact. PID can also occur following:  °· The birth of a baby.   °· A miscarriage.   °· An abortion.   °· Major pelvic surgery.   °· The use of an intrauterine device (IUD).   °· A sexual assault.   °RISK FACTORS °Certain factors can put a person at higher risk for PID, such as: °· Being younger than 25 years. °· Being sexually active at a young age. °· Using nonbarrier contraception. °· Having multiple sexual partners. °· Having sex with someone who has symptoms of a genital infection. °· Using oral contraception. °Other times, certain behaviors can increase the possibility of getting PID, such as: °· Having sex during your period. °· Using a vaginal douche. °· Having an intrauterine device (IUD) in place. °SYMPTOMS  °· Abdominal or pelvic pain.   °· Fever.   °· Chills.   °· Abnormal vaginal discharge. °· Abnormal uterine bleeding.   °· Unusual pain shortly after finishing your period. °DIAGNOSIS  °Your caregiver will choose some of the following methods to make a diagnosis, such as:  °· Performing a physical exam and history. A pelvic exam typically reveals a very tender uterus and surrounding pelvis.   °· Ordering laboratory tests including a pregnancy test, blood tests, and urine test.  °· Ordering cultures of the vagina and cervix to check for a sexually transmitted infection (STI). °· Performing an ultrasound.    °· Performing a laparoscopic procedure to look inside the pelvis.   °TREATMENT  °· Antibiotic medicines may be prescribed and taken by mouth.   °· Sexual partners may be treated when the infection is caused by a sexually transmitted disease (STD).   °· Hospitalization may be needed to give antibiotics intravenously. °· Surgery may be needed, but this is rare. °It may take weeks until you are completely well. If you are diagnosed with PID, you should also be checked for human immunodeficiency virus (HIV).   °HOME CARE INSTRUCTIONS  °· If given, take your antibiotics as directed. Finish the medicine even if you start to feel better.   °· Only take over-the-counter or prescription medicines for pain, discomfort, or fever as directed by your caregiver.   °· Do not have sexual intercourse until treatment is completed or as directed by your caregiver. If PID is confirmed, your recent sexual partner(s) will need treatment.   °· Keep your follow-up appointments. °SEEK MEDICAL CARE IF:  °· You have increased or abnormal vaginal discharge.   °· You need prescription medicine for your pain.   °· You vomit.   °· You cannot take your medicines.   °· Your partner has an STD.   °SEEK IMMEDIATE MEDICAL CARE IF:  °· You have a fever.   °· You have increased abdominal or pelvic pain.   °· You have chills.   °· You have pain when you urinate.   °· You are not better after 72 hours following treatment.   °MAKE SURE YOU:  °· Understand these instructions. °· Will watch your condition. °· Will get help right away if you are not doing well or get worse. °  Document Released: 07/05/2005 Document Revised: 10/30/2012 Document Reviewed: 07/01/2011 °ExitCare® Patient Information ©2015 [employer]. This information is not intended to replace advice given to you by your health care provider. Make sure you discuss any questions you have with your health care provider. ° °

## 2014-06-07 LAB — URINE CULTURE

## 2014-06-07 LAB — GC/CHLAMYDIA PROBE AMP
CT PROBE, AMP APTIMA: NEGATIVE
GC Probe RNA: NEGATIVE

## 2014-06-07 LAB — HIV ANTIBODY (ROUTINE TESTING W REFLEX): HIV: NONREACTIVE

## 2014-06-10 LAB — HERPES SIMPLEX VIRUS CULTURE: Culture: NOT DETECTED

## 2014-07-18 ENCOUNTER — Emergency Department (HOSPITAL_BASED_OUTPATIENT_CLINIC_OR_DEPARTMENT_OTHER)
Admission: EM | Admit: 2014-07-18 | Discharge: 2014-07-18 | Disposition: A | Discharge status: Home / Self Care | Payer: Self-pay | Attending: Emergency Medicine | Admitting: Emergency Medicine

## 2014-07-18 ENCOUNTER — Encounter (HOSPITAL_BASED_OUTPATIENT_CLINIC_OR_DEPARTMENT_OTHER): Payer: Self-pay | Admitting: Emergency Medicine

## 2014-07-18 DIAGNOSIS — Z79899 Other long term (current) drug therapy: Secondary | ICD-10-CM | POA: Insufficient documentation

## 2014-07-18 DIAGNOSIS — Z8619 Personal history of other infectious and parasitic diseases: Secondary | ICD-10-CM | POA: Insufficient documentation

## 2014-07-18 DIAGNOSIS — J029 Acute pharyngitis, unspecified: Secondary | ICD-10-CM | POA: Insufficient documentation

## 2014-07-18 LAB — RAPID STREP SCREEN (MED CTR MEBANE ONLY): STREPTOCOCCUS, GROUP A SCREEN (DIRECT): NEGATIVE

## 2014-07-18 MED ORDER — PREDNISONE 50 MG PO TABS
60.0000 mg | ORAL_TABLET | Freq: Once | ORAL | Status: AC
  Administered 2014-07-18: 60 mg via ORAL
  Filled 2014-07-18 (×2): qty 1

## 2014-07-18 MED ORDER — AMOXICILLIN 500 MG PO CAPS: 500.0000 mg | ORAL_CAPSULE | Freq: Three times a day (TID) | ORAL | Status: DC

## 2014-07-18 MED ORDER — HYDROCODONE-ACETAMINOPHEN 5-325 MG PO TABS: 2.0000 | ORAL_TABLET | ORAL | Status: DC | PRN

## 2014-07-18 NOTE — ED Notes (Signed)
Pt states she has had fever and sore throat since Sunday, states it feels like her tonsils are swollen.

## 2014-07-18 NOTE — Discharge Instructions (Signed)

## 2014-07-18 NOTE — ED Provider Notes (Signed)
CSN: [id number]     Arrival date & time 07/18/14  0037 History   First MD Initiated Contact with Patient 07/18/14 0044     Chief Complaint  Patient presents with  . Fever  . Sore Throat     (Consider location/radiation/quality/duration/timing/severity/associated sxs/prior Treatment) HPI Comments: Patient presents to the ER for evaluation of 3 days of fever and sore throat. Patient has not had any cough or congestion symptoms. Patient reports that throat hurts worse when she swallows. She does not have any swallowing difficulty.  Patient is a 28 y.o. female presenting with fever and pharyngitis.  Fever Associated symptoms: sore throat   Sore Throat    Past Medical History  Diagnosis Date  . Herpes   . SAYTKZSW(109.3Headache(784.0)    Past Surgical History  Procedure Laterality Date  . Knee surgery    . Dilation and curettage of uterus     Family History  Problem Relation Age of Onset  . Cancer Mother   . Asthma Mother   . Cancer Father   . GER disease Brother    History  Substance Use Topics  . Smoking status: Never Smoker   . Smokeless tobacco: Not on file  . Alcohol Use: No   OB History    Gravida Para Term Preterm AB TAB SAB Ectopic Multiple Living   2    2 1 1    0     Review of Systems  Constitutional: Positive for fever.  HENT: Positive for sore throat.   All other systems reviewed and are negative.     Allergies  Tomato  Home Medications   Prior to Admission medications   Medication Sig Start Date End Date Taking? Authorizing Provider  doxycycline (VIBRA-TABS) 100 MG tablet Take 1 tablet (100 mg total) by mouth 2 (two) times daily. 06/06/14   Heather Alger Memosonovan Hogan, CNM  norgestimate-ethinyl estradiol (ORTHO-CYCLEN,SPRINTEC,PREVIFEM) 0.25-35 MG-MCG tablet Take 1 tablet by mouth daily. Patient not taking: Reported on 06/06/2014 12/13/13   Wilmer FloorLisa A Leftwich-Kirby, CNM  valACYclovir (VALTREX) 500 MG tablet Take 1 tablet (500 mg total) by mouth 2 (two) times  daily. Patient not taking: Reported on 06/06/2014 12/13/13   Misty StanleyLisa A Leftwich-Kirby, CNM   BP 119/84 mmHg  Pulse 77  Temp(Src) 98.7 F (37.1 C) (Oral)  Resp 18  Ht 5\' 2"  (1.575 m)  Wt 148 lb (67.132 kg)  BMI 27.06 kg/m2  SpO2 99%  LMP 07/04/2014 Physical Exam  Constitutional: She is oriented to person, place, and time. She appears well-developed and well-nourished. No distress.  HENT:  Head: Normocephalic and atraumatic.  Right Ear: Hearing normal.  Left Ear: Hearing normal.  Nose: Nose normal.  Mouth/Throat: Mucous membranes are normal. Posterior oropharyngeal erythema present. No oropharyngeal exudate or tonsillar abscesses.  Eyes: Conjunctivae and EOM are normal. Pupils are equal, round, and reactive to light.  Neck: Normal range of motion. Neck supple.  Cardiovascular: Regular rhythm, S1 normal and S2 normal.  Exam reveals no gallop and no friction rub.   No murmur heard. Pulmonary/Chest: Effort normal and breath sounds normal. No respiratory distress. She exhibits no tenderness.  Abdominal: Soft. Normal appearance and bowel sounds are normal. There is no hepatosplenomegaly. There is no tenderness. There is no rebound, no guarding, no tenderness at McBurney's point and negative Murphy's sign. No hernia.  Musculoskeletal: Normal range of motion.  Lymphadenopathy:    She has cervical adenopathy.       Right cervical: Superficial cervical adenopathy present.  Left cervical: Superficial cervical adenopathy present.  Neurological: She is alert and oriented to person, place, and time. She has normal strength. No cranial nerve deficit or sensory deficit. Coordination normal. GCS eye subscore is 4. GCS verbal subscore is 5. GCS motor subscore is 6.  Skin: Skin is warm, dry and intact. No rash noted. No cyanosis.  Psychiatric: She has a normal mood and affect. Her speech is normal and behavior is normal. Thought content normal.  Nursing note and vitals reviewed.   ED Course   Procedures (including critical care time) Labs Review Labs Reviewed  RAPID STREP SCREEN    Imaging Review No results found.   EKG Interpretation None      MDM   Final diagnoses:  None   pharyngitis  Patient presents to the ER for evaluation of fever, sore throat for 3 days. Patient has isolated oropharyngeal erythema, lymphadenopathy without other cold symptoms. Rapid strep was negative, but I do still consider strep pharyngitis highly possible. Patient will be initiated on amoxicillin and pain control. Culture is pending.    Gilda Creasehristopher J. Pollina, MD 07/18/14 [id number]

## 2014-07-21 LAB — CULTURE, GROUP A STREP

## 2014-12-04 ENCOUNTER — Encounter (HOSPITAL_COMMUNITY): Payer: Self-pay | Admitting: *Deleted

## 2014-12-04 ENCOUNTER — Inpatient Hospital Stay (HOSPITAL_COMMUNITY)
Admission: AD | Admit: 2014-12-04 | Discharge: 2014-12-04 | Disposition: A | Discharge status: Home / Self Care | Payer: Self-pay | Source: Ambulatory Visit | Attending: Obstetrics & Gynecology | Admitting: Obstetrics & Gynecology

## 2014-12-04 DIAGNOSIS — R21 Rash and other nonspecific skin eruption: Secondary | ICD-10-CM | POA: Insufficient documentation

## 2014-12-04 DIAGNOSIS — N760 Acute vaginitis: Secondary | ICD-10-CM | POA: Insufficient documentation

## 2014-12-04 LAB — URINE MICROSCOPIC-ADD ON

## 2014-12-04 LAB — URINALYSIS, ROUTINE W REFLEX MICROSCOPIC
BILIRUBIN URINE: NEGATIVE
Glucose, UA: NEGATIVE mg/dL
KETONES UR: NEGATIVE mg/dL
Leukocytes, UA: NEGATIVE
NITRITE: NEGATIVE
PROTEIN: NEGATIVE mg/dL
SPECIFIC GRAVITY, URINE: 1.015 (ref 1.005–1.030)
Urobilinogen, UA: 0.2 mg/dL (ref 0.0–1.0)
pH: 6 (ref 5.0–8.0)

## 2014-12-04 LAB — POCT PREGNANCY, URINE: Preg Test, Ur: NEGATIVE

## 2014-12-04 NOTE — Discharge Instructions (Signed)

## 2014-12-04 NOTE — MAU Note (Signed)
?   Eczema. Has had a rash on both sides of her neck for about a month., had been itching - though not currently. (Plaque like build up on neck).  Vaginal area- is like raw, went to wash one day- guessed she rubbed it too hard- started to bleed. Bothering since last Friday

## 2014-12-04 NOTE — MAU Provider Note (Signed)
History     CSN: [id number]  Arrival date and time: 12/04/14 1623   First Provider Initiated Contact with Patient 12/04/14 1857      Chief Complaint  Patient presents with  . Rash   HPI Megan Pena 29 y.o. [phone] presents for vaginal rawness and rash on neck.  The rash has been present x 1 month, is itchy with raised bumps.  She has used lotion and tea tree oil for this with minimal improvement.  No aggravating or alleviating factors.  She does not have insurance and therefore has not seen derm.    Her vaginal rawness has been going on for some time.  It was evaluated by previous OB [place]YN in [place] but no real diagnosis.  It feels itchy and raw all the time.  No change with menses.  No change with yeast treatments.  She sometimes has bumps but not always.  No drainage noted.   OB History    Gravida Para Term Preterm AB TAB SAB Ectopic Multiple Living   2    2 1 1    0      Past Medical History  Diagnosis Date  . Herpes   . JYNWGNFA(213.0Headache(784.0)     Past Surgical History  Procedure Laterality Date  . Knee surgery    . Dilation and curettage of uterus      Family History  Problem Relation Age of Onset  . Cancer Mother   . Asthma Mother   . Cancer Father   . GER disease Brother     History  Substance Use Topics  . Smoking status: Never Smoker   . Smokeless tobacco: Not on file  . Alcohol Use: No    Allergies:  Allergies  Allergen Reactions  . Tomato Other (See Comments)    acne    Prescriptions prior to admission  Medication Sig Dispense Refill Last Dose  . amoxicillin (AMOXIL) 500 MG capsule Take 1 capsule (500 mg total) by mouth 3 (three) times daily. 30 capsule 0   . doxycycline (VIBRA-TABS) 100 MG tablet Take 1 tablet (100 mg total) by mouth 2 (two) times daily. 27 tablet 0   . HYDROcodone-acetaminophen (NORCO/VICODIN) 5-325 MG per tablet Take 2 tablets by mouth every 4 (four) hours as needed for moderate pain. 10 tablet 0   . norgestimate-ethinyl estradiol  (ORTHO-CYCLEN,SPRINTEC,PREVIFEM) 0.25-35 MG-MCG tablet Take 1 tablet by mouth daily. (Patient not taking: Reported on 06/06/2014) 1 Package 11 Has not started  . valACYclovir (VALTREX) 500 MG tablet Take 1 tablet (500 mg total) by mouth 2 (two) times daily. (Patient not taking: Reported on 06/06/2014) 10 tablet 5 Has not started    ROS Pertinent ROS in HPI.  All other systems are negative.   Physical Exam   Blood pressure 134/79, pulse 69, temperature 98.7 F (37.1 C), temperature source Oral, resp. rate 18, height 5' 2.5" (1.588 m), weight 154 lb (69.854 kg), last menstrual period 11/14/2014, unknown if currently breastfeeding.  Physical Exam  Constitutional: She appears well-developed and well-nourished. No distress.  HENT:  Head: Normocephalic and atraumatic.  Eyes: EOM are normal.  Neck: Normal range of motion.  Cardiovascular: Normal rate and regular rhythm.   Respiratory: Effort normal.  GI: Soft. Bowel sounds are normal. She exhibits no distension. There is no tenderness.  Genitourinary:  External genitalia with irritation.   Some erythema , some hyperpigmentation.  No drainage.  No satellite lesions.  Scant white discharge.  No CMT. No adnexal mass or tenderness appreciated.  Skin:  Lateral lower neck where collar would touch neck with hyperpigmentation and bumps.  NO rash or abnormality noted at anterior or posterior neck.      MAU Course  Procedures  MDM Pt is stable. No pregnancy.  No emergent care required  Assessment and Plan  A:  1. Vaginitis and vulvovaginitis   2. Rash and nonspecific skin eruption    P: Discharge to home Pt strongly advised to see dermatologist.  Alternatively, can see PCP.   OTC Cortisone to neck [facility] genitalia.   Patient may return to MAU as needed or if her condition were to change or worsen   Bertram Denvereague Clark, Kyli Sorter E 12/04/2014, 6:58 PM

## 2015-11-03 ENCOUNTER — Emergency Department (HOSPITAL_BASED_OUTPATIENT_CLINIC_OR_DEPARTMENT_OTHER)
Admission: EM | Admit: 2015-11-03 | Discharge: 2015-11-03 | Disposition: A | Discharge status: Home / Self Care | Payer: Self-pay | Attending: Emergency Medicine | Admitting: Emergency Medicine

## 2015-11-03 ENCOUNTER — Encounter (HOSPITAL_BASED_OUTPATIENT_CLINIC_OR_DEPARTMENT_OTHER): Payer: Self-pay

## 2015-11-03 DIAGNOSIS — L539 Erythematous condition, unspecified: Secondary | ICD-10-CM | POA: Insufficient documentation

## 2015-11-03 DIAGNOSIS — Z8619 Personal history of other infectious and parasitic diseases: Secondary | ICD-10-CM | POA: Insufficient documentation

## 2015-11-03 DIAGNOSIS — R21 Rash and other nonspecific skin eruption: Secondary | ICD-10-CM | POA: Insufficient documentation

## 2015-11-03 HISTORY — DX: Dermatitis, unspecified: L30.9

## 2015-11-03 MED ORDER — TRIAMCINOLONE ACETONIDE 0.1 % EX CREA: 1.0000 "application " | TOPICAL_CREAM | Freq: Two times a day (BID) | CUTANEOUS | Status: DC

## 2015-11-03 MED FILL — TRIAMCINOLONE 0.1% CREAM: 0.1 | 15 days supply | Qty: 30 | Fill #0

## 2015-11-03 NOTE — ED Notes (Signed)
Pt reports pruritic rash intermittently to bilateral arms for about 1 week.

## 2015-11-03 NOTE — ED Provider Notes (Signed)
CSN: [id number]     Arrival date & time 11/03/15  1451 History  By signing my name below, I, Linus GalasMaharshi Patel, attest that this documentation has been prepared under the direction and in the presence of No att. providers found. Electronically Signed: Linus GalasMaharshi Patel, ED Scribe. 11/04/2015. 4:14 PM.   Chief Complaint  Patient presents with  . Rash   The history is provided by the patient. No language interpreter was used.   HPI Comments: Megan Pena is a 30 y.o. female who presents to the Emergency Department with a PMHx of exzema complaining of a rash to the crease of her bilateral elbows for the past 1 week. Pt states when she scratches the rash it spreads. Pt denies possible poison ivy exposure as she has been mostly indoors. She used vinegar, then dried it off to relieve her itching. Pt tried benadryl cream and anti-itch cream with no releif. Pt denies any fevers, chills, SOB, N/V/D or any other symptoms at this time.   Past Medical History  Diagnosis Date  . Herpes   . Headache(784.0)   . Eczema    Past Surgical History  Procedure Laterality Date  . Knee surgery    . Dilation and curettage of uterus     Family History  Problem Relation Age of Onset  . Cancer Mother   . Asthma Mother   . Cancer Father   . GER disease Brother    Social History  Substance Use Topics  . Smoking status: Never Smoker   . Smokeless tobacco: None  . Alcohol Use: No   OB History    Gravida Para Term Preterm AB TAB SAB Ectopic Multiple Living   2    2 1 1    0     Review of Systems  Constitutional: Negative for fever and chills.  Respiratory: Negative for shortness of breath.   Gastrointestinal: Negative for nausea, vomiting and diarrhea.  Skin: Positive for rash.  All other systems reviewed and are negative.  Allergies  Tomato  Home Medications   Prior to Admission medications   Medication Sig Start Date End Date Taking? Authorizing Provider  triamcinolone cream (KENALOG) 0.1 % Apply 1  application topically 2 (two) times daily. 11/03/15   Jerelyn ScottMartha Linker, MD   BP 121/75 mmHg  Pulse 82  Temp(Src) 99.4 F (37.4 C) (Oral)  Resp 16  Ht 5\' 4"  (1.626 m)  Wt 160 lb (72.576 kg)  BMI 27.45 kg/m2  SpO2 100%  Vitals reviewed Physical Exam  Physical Examination: General appearance - alert, well appearing, and in no distress Mental status - alert, oriented to person, place, and time Eyes - no conjunctival injection, no scleral icterus Chest - clear to auscultation, no wheezes, rales or rhonchi, symmetric air entry Neurological - alert, oriented, normal speech Extremities - peripheral pulses normal, no pedal edema, no clubbing or cyanosis Skin - normal coloration and turgor, at antecubital fossa bilaterally there are dry erythematous patches with some overlying excoriations, no surrounding erythema, no induration or fluctuance  ED Course  Procedures   DIAGNOSTIC STUDIES: Oxygen Saturation is 100% on room air, normal by my interpretation.    COORDINATION OF CARE: 4:11 PM Discussed treatment plan including rx for triamcinolone cream with pt at bedside and pt agreed to plan.  MDM   Final diagnoses:  Rash    Pt presenting with c/o rash on bilateral elbows, rash appears eczematous in nature.  Pt advised benadryl and steroid cream.  No systemic symptoms.  Discharged with  strict return precautions.  Pt agreeable with plan.  I personally performed the services described in this documentation, which was scribed in my presence. The recorded information has been reviewed and is accurate.     Jerelyn Scott, MD 11/04/15 [phone]

## 2015-11-03 NOTE — Discharge Instructions (Signed)
Return to the ED with any concerns including difficulty breathing, lip or tongue swelling, increased area of redness, pus draining, or any other alarming symptoms

## 2015-11-03 NOTE — ED Notes (Signed)
MD at bedside. 

## 2016-04-20 ENCOUNTER — Other Ambulatory Visit: Payer: Self-pay | Admitting: Physician Assistant

## 2016-04-20 ENCOUNTER — Other Ambulatory Visit (HOSPITAL_COMMUNITY)
Admission: RE | Admit: 2016-04-20 | Discharge: 2016-04-20 | Disposition: A | Discharge status: Home / Self Care | Payer: BLUE CROSS/BLUE SHIELD | Source: Ambulatory Visit | Attending: Family Medicine | Admitting: Family Medicine

## 2016-04-20 DIAGNOSIS — Z124 Encounter for screening for malignant neoplasm of cervix: Secondary | ICD-10-CM | POA: Insufficient documentation

## 2016-04-22 LAB — CYTOLOGY - PAP

## 2016-07-19 NOTE — L&D Delivery Note (Signed)
Operative Delivery Note At 7:26 AM a viable and healthy female was delivered via Vaginal, Vac[employer], operational).  Presentation: vertex; Position: Right,, Occiput,, Anterior; Station: +3.  Verbal consent: obtained from patient.  Risks and benefits discussed in detail.  Risks include, but are not limited to the risks of anesthesia, bleeding, infection, damage to maternal tissues, fetal cephalhematoma.  There is also the risk of inability to effect vaginal delivery of the head, or shoulder dystocia that cannot be resolved by established maneuvers, leading to the need for emergency cesarean section. Indication: repetitive deep variable decels APGAR: 4, 7; weight 4 lb 6.9 oz (2010 g).   Placenta status:spont, intact sent to path , .   Cord: short and wrapped around legs  with the following complications: .  Cord pH: pending  Anesthesia:  epidural Instruments: mityvac Episiotomy: None Lacerations: None Suture Repair: none Est. Blood Loss (mL): 200  Mom to postpartum.  Baby to NICU.  Kevonte Vanecek A 04/09/2017, 7:47 AM

## 2016-09-03 ENCOUNTER — Emergency Department (HOSPITAL_BASED_OUTPATIENT_CLINIC_OR_DEPARTMENT_OTHER): Payer: BLUE CROSS/BLUE SHIELD

## 2016-09-03 ENCOUNTER — Emergency Department (HOSPITAL_BASED_OUTPATIENT_CLINIC_OR_DEPARTMENT_OTHER)
Admission: EM | Admit: 2016-09-03 | Discharge: 2016-09-03 | Disposition: A | Discharge status: Home / Self Care | Payer: BLUE CROSS/BLUE SHIELD | Attending: Emergency Medicine | Admitting: Emergency Medicine

## 2016-09-03 ENCOUNTER — Encounter (HOSPITAL_BASED_OUTPATIENT_CLINIC_OR_DEPARTMENT_OTHER): Payer: Self-pay | Admitting: Emergency Medicine

## 2016-09-03 DIAGNOSIS — Z3A01 Less than 8 weeks gestation of pregnancy: Secondary | ICD-10-CM | POA: Insufficient documentation

## 2016-09-03 DIAGNOSIS — R102 Pelvic and perineal pain: Secondary | ICD-10-CM | POA: Insufficient documentation

## 2016-09-03 DIAGNOSIS — O26891 Other specified pregnancy related conditions, first trimester: Secondary | ICD-10-CM | POA: Insufficient documentation

## 2016-09-03 DIAGNOSIS — Z3490 Encounter for supervision of normal pregnancy, unspecified, unspecified trimester: Secondary | ICD-10-CM

## 2016-09-03 LAB — WET PREP, GENITAL
Clue Cells Wet Prep HPF POC: NONE SEEN
SPERM: NONE SEEN
Trich, Wet Prep: NONE SEEN
Yeast Wet Prep HPF POC: NONE SEEN

## 2016-09-03 LAB — URINALYSIS, ROUTINE W REFLEX MICROSCOPIC
Bilirubin Urine: NEGATIVE
Glucose, UA: NEGATIVE mg/dL
HGB URINE DIPSTICK: NEGATIVE
KETONES UR: NEGATIVE mg/dL
Leukocytes, UA: NEGATIVE
Nitrite: NEGATIVE
PROTEIN: NEGATIVE mg/dL
Specific Gravity, Urine: 1.018 (ref 1.005–1.030)
pH: 7 (ref 5.0–8.0)

## 2016-09-03 LAB — PREGNANCY, URINE: Preg Test, Ur: POSITIVE — AB

## 2016-09-03 LAB — HCG, QUANTITATIVE, PREGNANCY: HCG, BETA CHAIN, QUANT, S: 11397 m[IU]/mL — AB (ref <5)

## 2016-09-03 MED ORDER — PRENATAL VITAMIN 27-0.8 MG PO TABS: 1.0000 | ORAL_TABLET | Freq: Every morning | ORAL | 0 refills | Status: DC

## 2016-09-03 MED FILL — PRENATAL VITAMIN PLUS LOW I: 27-1 | 30 days supply | Qty: 30 | Fill #0

## 2016-09-03 NOTE — ED Notes (Signed)
Patient transported to Ultrasound 

## 2016-09-03 NOTE — ED Provider Notes (Signed)
[facility] Provider Note   CSN: [id number] Arrival date & time: 09/03/16  0804     History   Chief Complaint Chief Complaint  Patient presents with  . Abdominal Cramping    HPI Megan Pena is a 31 y.o. female.Complete of mild crampy lower abdominal pain, suprapubic in location, nonradiating onset upon awakening yesterday 6:30 AM. Nothing makes symptoms better or worse. No other associated symptoms. No treatment prior to coming here. Last normal menstrual period one month ago. last bowel movement yesterday, normal. No anorexia no fever no urinary symptoms no other associated symptoms.  HPI  Past Medical History:  Diagnosis Date  . Eczema   . Headache(784.0)   . Herpes     There are no active problems to display for this patient.   Past Surgical History:  Procedure Laterality Date  . DILATION AND CURETTAGE OF UTERUS    . KNEE SURGERY      OB History    Gravida Para Term Preterm AB Living   2       2 0   SAB TAB Ectopic Multiple Live Births   1 1           1  miscarriage one abortion  Home Medications    Prior to Admission medications   Medication Sig Start Date End Date Taking? Authorizing Provider  triamcinolone cream (KENALOG) 0.1 % Apply 1 application topically 2 (two) times daily. 11/03/15   Jerelyn Scott, MD    Family History Family History  Problem Relation Age of Onset  . Cancer Mother   . Asthma Mother   . Cancer Father   . GER disease Brother     Social History Social History  Substance Use Topics  . Smoking status: Never Smoker  . Smokeless tobacco: Not on file  . Alcohol use No   Occasional alcohol use no illicit drug use  Allergies   Tomato   Review of Systems Review of Systems  Constitutional: Negative.   HENT: Negative.   Respiratory: Negative.   Cardiovascular: Negative.   Gastrointestinal: Positive for abdominal pain.  Musculoskeletal: Negative.   Skin: Negative.   Neurological: Negative.     Psychiatric/Behavioral: Negative.   All other systems reviewed and are negative.    Physical Exam Updated Vital Signs There were no vitals taken for this visit.  Physical Exam  Constitutional: She is oriented to person, place, and time. She appears well-developed and well-nourished. No distress.  HENT:  Head: Normocephalic and atraumatic.  Eyes: Conjunctivae are normal. Pupils are equal, round, and reactive to light.  Neck: Neck supple. No tracheal deviation present. No thyromegaly present.  Cardiovascular: Normal rate and regular rhythm.   No murmur heard. Pulmonary/Chest: Effort normal and breath sounds normal.  Abdominal: Soft. Bowel sounds are normal. She exhibits no distension. There is no tenderness.  Genitourinary:  Genitourinary Comments: Pelvic exam no external lesion, slight amount of whitish vaginal discharge in vault. Cervical os closed. No cervical motion tenderness no adnexal masses or tenderness  Musculoskeletal: Normal range of motion. She exhibits no edema or tenderness.  Neurological: She is alert and oriented to person, place, and time. Coordination normal.  Skin: Skin is warm and dry. No rash noted.  Psychiatric: She has a normal mood and affect.  Nursing note and vitals reviewed.    ED Treatments / Results  Labs (all labs ordered are listed, but only abnormal results are displayed) Labs Reviewed  WET PREP, GENITAL  URINALYSIS, ROUTINE W REFLEX MICROSCOPIC  PREGNANCY,  URINE  RPR  HIV ANTIBODY (ROUTINE TESTING)  GC/CHLAMYDIA PROBE AMP ([place]) NOT AT [facility]    EKG  EKG Interpretation None      Results for orders placed or performed during the hospital encounter of 09/03/16  Wet prep, genital  Result Value Ref Range   Yeast Wet Prep HPF POC NONE SEEN NONE SEEN   Trich, Wet Prep NONE SEEN NONE SEEN   Clue Cells Wet Prep HPF POC NONE SEEN NONE SEEN   WBC, Wet Prep HPF POC MODERATE (A) NONE SEEN   Sperm NONE SEEN   Urinalysis, Routine w  reflex microscopic  Result Value Ref Range   Color, Urine YELLOW YELLOW   APPearance CLOUDY (A) CLEAR   Specific Gravity, Urine 1.018 1.005 - 1.030   pH 7.0 5.0 - 8.0   Glucose, UA NEGATIVE NEGATIVE mg/dL   Hgb urine dipstick NEGATIVE NEGATIVE   Bilirubin Urine NEGATIVE NEGATIVE   Ketones, ur NEGATIVE NEGATIVE mg/dL   Protein, ur NEGATIVE NEGATIVE mg/dL   Nitrite NEGATIVE NEGATIVE   Leukocytes, UA NEGATIVE NEGATIVE  Pregnancy, urine  Result Value Ref Range   Preg Test, Ur POSITIVE (A) NEGATIVE  hCG, quantitative, pregnancy  Result Value Ref Range   hCG, Beta Chain, Quant, S 11,397 (H) <5 mIU/mL   [place] Ob Comp Less 14 Wks  Result Date: 09/03/2016 CLINICAL DATA:  Pelvic pain for 1 day EXAM: OBSTETRIC <14 WK US AND TRANSVAGINAL OB US TECHNIQUE: Both transabdominal and transvaginal ultrasound examinations were performed for complete evaluation of the gestation as well as the maternal uterus, adnexal regions, and pelvic cul-de-sac. Transvaginal technique was performed to assess early pregnancy. COMPARISON:  None. FINDINGS: Intrauterine gestational sac: Visualized Yolk sac:  Visualized Embryo:  Not visualized Cardiac Activity: Not visualized MSD: 9 mm 5 w   5  d Subchorionic hemorrhage:  None visualized. Maternal uterus/adnexae: Within the uterus, there is a 0.9 x 0.7 x 1.0 cm hypoechoic mass consistent with a leiomyoma. Cervical os is closed. Maternal ovaries appear normal bilaterally. There is a corpus luteum in the right ovary measuring 1 x 1 cm which appears slightly irregular and contour. There is moderate free fluid in the right adnexal region. IMPRESSION: Yolk sac containing gestational sac within the uterus. Based on gestational sac size, estimated gestational age is 6- weeks. Fetal pole and fetal cardiac activity not yet seen. Given this circumstance, followup ultrasound in 10-14 days is advised to further assess. Small intrauterine leiomyoma measuring 0.9 x 0.7 x 1.0 cm. Suspect recent corpus  luteum rupture on the right with free fluid in the right adnexal region. Electronically Signed   By: Bretta BangWilliam  Woodruff III M.D.   On: 09/03/2016 10:27   [place] Ob Transvaginal  Result Date: 09/03/2016 CLINICAL DATA:  Pelvic pain for 1 day EXAM: OBSTETRIC <14 WK US AND TRANSVAGINAL OB US TECHNIQUE: Both transabdominal and transvaginal ultrasound examinations were performed for complete evaluation of the gestation as well as the maternal uterus, adnexal regions, and pelvic cul-de-sac. Transvaginal technique was performed to assess early pregnancy. COMPARISON:  None. FINDINGS: Intrauterine gestational sac: Visualized Yolk sac:  Visualized Embryo:  Not visualized Cardiac Activity: Not visualized MSD: 9 mm 5 w   5  d Subchorionic hemorrhage:  None visualized. Maternal uterus/adnexae: Within the uterus, there is a 0.9 x 0.7 x 1.0 cm hypoechoic mass consistent with a leiomyoma. Cervical os is closed. Maternal ovaries appear normal bilaterally. There is a corpus luteum in the right ovary measuring 1 x 1 cm  which appears slightly irregular and contour. There is moderate free fluid in the right adnexal region. IMPRESSION: Yolk sac containing gestational sac within the uterus. Based on gestational sac size, estimated gestational age is 6- weeks. Fetal pole and fetal cardiac activity not yet seen. Given this circumstance, followup ultrasound in 10-14 days is advised to further assess. Small intrauterine leiomyoma measuring 0.9 x 0.7 x 1.0 cm. Suspect recent corpus luteum rupture on the right with free fluid in the right adnexal region. Electronically Signed   By: Bretta Bang III M.D.   On: 09/03/2016 10:27   Radiology No results found.  Procedures Procedures (including critical care time)  Medications Ordered in ED Medications - No data to display  1 PM patient resting comfortably plan prescription prenatal vitamins. Follow-up with gynecologist for recheck of IN 10-14 DAYS Initial Impression / Assessment and  Plan / ED Course  I have reviewed the triage vital signs and the nursing notes.  Pertinent labs & imaging results that were available during my care of the patient were reviewed by me and considered in my medical decision making (see chart for details).       Final Clinical Impressions(s) / ED Diagnoses  Diagnosis intrauterine pregnancy Final diagnoses:  None    New Prescriptions New Prescriptions   No medications on file     Doug Sou, MD 09/03/16 1303

## 2016-09-03 NOTE — ED Notes (Signed)
ED Provider at bedside. 

## 2016-09-03 NOTE — ED Notes (Signed)
Blood obtained via Vein, not Capillary.

## 2016-09-03 NOTE — Discharge Instructions (Signed)
Based on ultrasound today UR proximal [redacted] weeks pregnant. No heartbeat of the baby is seen yet. It may be too early to see it. Call your gynecologist today or Monday, 09/06/2016 to arrange an office visit and to request a repeat ultrasound in 10-14 days

## 2016-09-03 NOTE — ED Notes (Signed)
Pelvic Cart at bedside 

## 2016-09-03 NOTE — ED Triage Notes (Signed)
Pt reports lower abd cramping x yesterday, states she felt this pain in the past when she was pregnant. Pt is unsure of her lmp, denies any spotting or discharge. Pt states she took 2 hpt's which were positive, but she wanted to come in here to be sure. md at bedside, remaining triage deferred.

## 2016-09-04 LAB — HIV ANTIBODY (ROUTINE TESTING W REFLEX): HIV SCREEN 4TH GENERATION: NONREACTIVE

## 2016-09-04 LAB — RPR: RPR: NONREACTIVE

## 2016-09-06 ENCOUNTER — Inpatient Hospital Stay (HOSPITAL_COMMUNITY)
Admission: AD | Admit: 2016-09-06 | Discharge: 2016-09-06 | Discharge status: Against Medical Advice | Payer: BLUE CROSS/BLUE SHIELD | Source: Ambulatory Visit | Attending: Obstetrics & Gynecology | Admitting: Obstetrics & Gynecology

## 2016-09-06 ENCOUNTER — Encounter (HOSPITAL_COMMUNITY): Payer: Self-pay | Admitting: *Deleted

## 2016-09-06 DIAGNOSIS — Z5321 Procedure and treatment not carried out due to patient leaving prior to being seen by health care provider: Secondary | ICD-10-CM | POA: Insufficient documentation

## 2016-09-06 DIAGNOSIS — R1030 Lower abdominal pain, unspecified: Secondary | ICD-10-CM | POA: Insufficient documentation

## 2016-09-06 LAB — URINALYSIS, ROUTINE W REFLEX MICROSCOPIC
Bilirubin Urine: NEGATIVE
GLUCOSE, UA: NEGATIVE mg/dL
Hgb urine dipstick: NEGATIVE
KETONES UR: NEGATIVE mg/dL
LEUKOCYTES UA: NEGATIVE
Nitrite: NEGATIVE
PH: 6 (ref 5.0–8.0)
Protein, ur: NEGATIVE mg/dL
SPECIFIC GRAVITY, URINE: 1.009 (ref 1.005–1.030)

## 2016-09-06 LAB — GC/CHLAMYDIA PROBE AMP (~~LOC~~) NOT AT ARMC
CHLAMYDIA, DNA PROBE: NEGATIVE
Neisseria Gonorrhea: NEGATIVE

## 2016-09-06 NOTE — MAU Note (Signed)
Not in lobby x3. Pt left AMA  

## 2016-09-06 NOTE — MAU Note (Signed)
Not in lobby x2.

## 2016-09-06 NOTE — MAU Note (Signed)
Pt presents to MAU with complaint of lower abdominal cramping. States that she could not sleep last night and thinks she may have pulled something in her abdomen and has been cramping since. Denies any VB or abnormal discharge

## 2016-09-06 NOTE — MAU Note (Signed)
Not in lobby x1  

## 2016-09-06 NOTE — MAU Note (Signed)
Urine sent to lab 

## 2016-09-10 ENCOUNTER — Emergency Department (HOSPITAL_BASED_OUTPATIENT_CLINIC_OR_DEPARTMENT_OTHER)
Admission: EM | Admit: 2016-09-10 | Discharge: 2016-09-10 | Disposition: A | Discharge status: Home / Self Care | Payer: BLUE CROSS/BLUE SHIELD | Attending: Dermatology | Admitting: Dermatology

## 2016-09-10 ENCOUNTER — Encounter (HOSPITAL_BASED_OUTPATIENT_CLINIC_OR_DEPARTMENT_OTHER): Payer: Self-pay | Admitting: *Deleted

## 2016-09-10 DIAGNOSIS — Z5321 Procedure and treatment not carried out due to patient leaving prior to being seen by health care provider: Secondary | ICD-10-CM | POA: Insufficient documentation

## 2016-09-10 DIAGNOSIS — R109 Unspecified abdominal pain: Secondary | ICD-10-CM | POA: Insufficient documentation

## 2016-09-10 LAB — URINALYSIS, ROUTINE W REFLEX MICROSCOPIC
Bilirubin Urine: NEGATIVE
Glucose, UA: NEGATIVE mg/dL
HGB URINE DIPSTICK: NEGATIVE
KETONES UR: NEGATIVE mg/dL
Leukocytes, UA: NEGATIVE
Nitrite: NEGATIVE
PH: 6 (ref 5.0–8.0)
Protein, ur: NEGATIVE mg/dL
Specific Gravity, Urine: 1.02 (ref 1.005–1.030)

## 2016-09-10 LAB — PREGNANCY, URINE: Preg Test, Ur: POSITIVE — AB

## 2016-09-10 NOTE — ED Triage Notes (Signed)
Pt reports continued abd cramping was seen last week for same sx denies vaginal bleeding

## 2016-09-11 ENCOUNTER — Encounter (HOSPITAL_BASED_OUTPATIENT_CLINIC_OR_DEPARTMENT_OTHER): Payer: Self-pay | Admitting: Emergency Medicine

## 2016-09-11 ENCOUNTER — Emergency Department (HOSPITAL_BASED_OUTPATIENT_CLINIC_OR_DEPARTMENT_OTHER)
Admission: EM | Admit: 2016-09-11 | Discharge: 2016-09-11 | Disposition: A | Discharge status: Home / Self Care | Payer: BLUE CROSS/BLUE SHIELD | Attending: Emergency Medicine | Admitting: Emergency Medicine

## 2016-09-11 DIAGNOSIS — Z79899 Other long term (current) drug therapy: Secondary | ICD-10-CM | POA: Insufficient documentation

## 2016-09-11 DIAGNOSIS — R109 Unspecified abdominal pain: Secondary | ICD-10-CM

## 2016-09-11 DIAGNOSIS — R102 Pelvic and perineal pain: Secondary | ICD-10-CM | POA: Insufficient documentation

## 2016-09-11 DIAGNOSIS — O26891 Other specified pregnancy related conditions, first trimester: Secondary | ICD-10-CM | POA: Insufficient documentation

## 2016-09-11 DIAGNOSIS — Z3A01 Less than 8 weeks gestation of pregnancy: Secondary | ICD-10-CM | POA: Insufficient documentation

## 2016-09-11 LAB — HCG, QUANTITATIVE, PREGNANCY: hCG, Beta Chain, Quant, S: 54757 m[IU]/mL — ABNORMAL HIGH (ref <5)

## 2016-09-11 MED ORDER — ACETAMINOPHEN 325 MG PO TABS
650.0000 mg | ORAL_TABLET | Freq: Once | ORAL | Status: AC
  Administered 2016-09-11: 650 mg via ORAL
  Filled 2016-09-11: qty 2

## 2016-09-11 MED ORDER — SODIUM CHLORIDE 0.9 % IV BOLUS (SEPSIS)
1000.0000 mL | Freq: Once | INTRAVENOUS | Status: AC
  Administered 2016-09-11: 1000 mL via INTRAVENOUS

## 2016-09-11 NOTE — Discharge Instructions (Signed)
Push fluids to stay hydrated.  Follow up with OB as scheduled.

## 2016-09-11 NOTE — ED Triage Notes (Signed)
Pt reports lower bilateral abdominal cramping for past several days.  Pt reports she is about [redacted] weeks pregnant.  Pt denies any bleeding or discharge.

## 2016-09-11 NOTE — ED Provider Notes (Signed)
[facility] Provider Note   CSN: [id number] Arrival date & time: 09/11/16  1029     History   Chief Complaint Chief Complaint  Patient presents with  . Abdominal Cramping    HPI Megan Pena is a 31 y.o. female. Chief complaint is lower abdominal cramping  HPI patient presents approximately 5-[redacted] weeks pregnant by her dates with abdominal cramping. Seen and evaluated 1 week ago. Had quantitative hCG of 11,000. Had intrauterine pregnancy without initiated fetal heart tones. Had visualized corpus luteum, with surrounding fluid,? Ruptured corpus luteum cyst. Has had nausea and is unable to hydrate poorly this week. No bleeding or spotting. Nausea but no vomiting. Cramping is midline and bilateral lower abdomen.  Past Medical History:  Diagnosis Date  . Eczema   . Headache(784.0)   . Herpes     There are no active problems to display for this patient.   Past Surgical History:  Procedure Laterality Date  . DILATION AND CURETTAGE OF UTERUS    . KNEE SURGERY      OB History    Gravida Para Term Preterm AB Living   3       2 0   SAB TAB Ectopic Multiple Live Births   1 1             Home Medications    Prior to Admission medications   Medication Sig Start Date End Date Taking? Authorizing Provider  Prenatal Vit-Fe Fumarate-FA (PRENATAL VITAMIN) 27-0.8 MG TABS Take 1 tablet by mouth every morning. 09/03/16  Yes Doug Sou, MD    Family History Family History  Problem Relation Age of Onset  . Cancer Mother   . Asthma Mother   . Cancer Father   . GER disease Brother     Social History Social History  Substance Use Topics  . Smoking status: Never Smoker  . Smokeless tobacco: Never Used  . Alcohol use No     Allergies   Tomato   Review of Systems Review of Systems  Constitutional: Negative for appetite change, chills, diaphoresis, fatigue and fever.  HENT: Negative for mouth sores, sore throat and trouble swallowing.   Eyes: Negative for  visual disturbance.  Respiratory: Negative for cough, chest tightness, shortness of breath and wheezing.   Cardiovascular: Negative for chest pain.  Gastrointestinal: Negative for abdominal distention, abdominal pain, diarrhea, nausea and vomiting.  Endocrine: Negative for polydipsia, polyphagia and polyuria.  Genitourinary: Positive for pelvic pain. Negative for dysuria, frequency and hematuria.  Musculoskeletal: Negative for gait problem.  Skin: Negative for color change, pallor and rash.  Neurological: Negative for dizziness, syncope, light-headedness and headaches.  Hematological: Does not bruise/bleed easily.  Psychiatric/Behavioral: Negative for behavioral problems and confusion.     Physical Exam Updated Vital Signs BP 106/60 (BP Location: Left Arm)   Pulse 74   Temp 98.1 F (36.7 C) (Oral)   Resp 15   Ht 5\' 2"  (1.575 m)   Wt 155 lb (70.3 kg)   LMP 08/02/2016   SpO2 98%   BMI 28.35 kg/m   Physical Exam  Constitutional: She is oriented to person, place, and time. She appears well-developed and well-nourished. No distress.  HENT:  Head: Normocephalic.  Eyes: Conjunctivae are normal. Pupils are equal, round, and reactive to light. No scleral icterus.  Neck: Normal range of motion. Neck supple. No thyromegaly present.  Cardiovascular: Normal rate and regular rhythm.  Exam reveals no gallop and no friction rub.   No murmur heard. Pulmonary/Chest: Effort  normal and breath sounds normal. No respiratory distress. She has no wheezes. She has no rales.  Abdominal: Soft. Bowel sounds are normal. She exhibits no distension. There is no tenderness. There is no rebound.  No reproducible tenderness in the abdomen or pelvis. No guarding or rebound.  Musculoskeletal: Normal range of motion.  Neurological: She is alert and oriented to person, place, and time.  Skin: Skin is warm and dry. No rash noted.  Psychiatric: She has a normal mood and affect. Her behavior is normal.     ED  Treatments / Results  Labs (all labs ordered are listed, but only abnormal results are displayed) Labs Reviewed  HCG, QUANTITATIVE, PREGNANCY - Abnormal; Notable for the following:       Result Value   hCG, Beta Chain, Quant, S 54,757 (*)    All other components within normal limits    EKG  EKG Interpretation None       Radiology No results found.  Procedures Procedures (including critical care time)  Medications Ordered in ED Medications  sodium chloride 0.9 % bolus 1,000 mL (0 mLs Intravenous Stopped 09/11/16 1318)  acetaminophen (TYLENOL) tablet 650 mg (650 mg Oral Given 09/11/16 1121)     Initial Impression / Assessment and Plan / ED Course  I have reviewed the triage vital signs and the nursing notes.  Pertinent labs & imaging results that were available during my care of the patient were reviewed by me and considered in my medical decision making (see chart for details).     Pain could be from simple ruptured corpus luteum. Could be uterine cramping if somewhat dehydrated. Bedside ultrasound shows intrauterine process. I cannot demonstrate fetal heart tone. We will recheck quantitative hCG to see if improving. Hydration. Reassessment.  Final Clinical Impressions(s) / ED Diagnoses   Final diagnoses:  Abdominal pain during pregnancy in first trimester   Quant is increasing. No bleeding. Feeling better after fluids. Appropriate for discharge home to continue by mouth hydration  New Prescriptions New Prescriptions   No medications on file     Rolland PorterMark Karrington Mccravy, MD 09/11/16 1326

## 2016-09-27 ENCOUNTER — Other Ambulatory Visit: Payer: Self-pay | Admitting: Obstetrics and Gynecology

## 2016-09-27 DIAGNOSIS — R1011 Right upper quadrant pain: Secondary | ICD-10-CM

## 2016-09-29 ENCOUNTER — Encounter (HOSPITAL_BASED_OUTPATIENT_CLINIC_OR_DEPARTMENT_OTHER): Payer: Self-pay | Admitting: *Deleted

## 2016-09-29 ENCOUNTER — Emergency Department (HOSPITAL_BASED_OUTPATIENT_CLINIC_OR_DEPARTMENT_OTHER)
Admission: EM | Admit: 2016-09-29 | Discharge: 2016-09-29 | Disposition: A | Discharge status: Home / Self Care | Payer: BLUE CROSS/BLUE SHIELD | Attending: Emergency Medicine | Admitting: Emergency Medicine

## 2016-09-29 DIAGNOSIS — Z3A08 8 weeks gestation of pregnancy: Secondary | ICD-10-CM | POA: Insufficient documentation

## 2016-09-29 DIAGNOSIS — N898 Other specified noninflammatory disorders of vagina: Secondary | ICD-10-CM | POA: Insufficient documentation

## 2016-09-29 DIAGNOSIS — R1030 Lower abdominal pain, unspecified: Secondary | ICD-10-CM | POA: Insufficient documentation

## 2016-09-29 DIAGNOSIS — O26891 Other specified pregnancy related conditions, first trimester: Secondary | ICD-10-CM | POA: Insufficient documentation

## 2016-09-29 DIAGNOSIS — R109 Unspecified abdominal pain: Secondary | ICD-10-CM

## 2016-09-29 LAB — WET PREP, GENITAL
Clue Cells Wet Prep HPF POC: NONE SEEN
Sperm: NONE SEEN
Trich, Wet Prep: NONE SEEN
YEAST WET PREP: NONE SEEN

## 2016-09-29 LAB — URINALYSIS, ROUTINE W REFLEX MICROSCOPIC
BILIRUBIN URINE: NEGATIVE
GLUCOSE, UA: NEGATIVE mg/dL
HGB URINE DIPSTICK: NEGATIVE
Ketones, ur: NEGATIVE mg/dL
Leukocytes, UA: NEGATIVE
Nitrite: NEGATIVE
PH: 7 (ref 5.0–8.0)
PROTEIN: NEGATIVE mg/dL
SPECIFIC GRAVITY, URINE: 1.022 (ref 1.005–1.030)

## 2016-09-29 LAB — PREGNANCY, URINE: PREG TEST UR: POSITIVE — AB

## 2016-09-29 NOTE — ED Triage Notes (Signed)
Pt amb to room 7 with quick steady gait in nad. Pt reports cramping abd pain the entire 8 weeks of her pregnancy, told by her gyn "uterus is stretching" and pain is normal, pt states she cannot sleep at night due to the pains. Pt also reports some white d/c in the toilet this am. Denies any vag bleeding.

## 2016-09-29 NOTE — ED Notes (Signed)
ED Provider at bedside. 

## 2016-09-29 NOTE — ED Provider Notes (Signed)
[facility]   CSN: [id number] Arrival date & time: 09/29/16  0805     History   Chief Complaint Chief Complaint  Patient presents with  . Vaginal Discharge    HPI Megan Pena is a 31 y.o. female.  HPI  31 year old G3 P0 presents with abdominal pain. She thinks she is approximately [redacted] weeks pregnant. She has had abdominal pain for about 2 weeks. Comes and goes. Nothing seems to specifically make it better or worse. She has also had increased urination without pain for the last 1-2 weeks. Has had white vaginal discharge for 1 week. She has noticed the discharge while urinating. She has not seen any vaginal bleeding. No fevers, back pain, or vomiting. Was told this was related to her pregnancy but nothing alarming according to her OB/GYN. She has not taken Tylenol because she more wants the pain to go rather than covered up.  Past Medical History:  Diagnosis Date  . Eczema   . Headache(784.0)   . Herpes     There are no active problems to display for this patient.   Past Surgical History:  Procedure Laterality Date  . DILATION AND CURETTAGE OF UTERUS    . KNEE SURGERY      OB History    Gravida Para Term Preterm AB Living   3       2 0   SAB TAB Ectopic Multiple Live Births   1 1             Home Medications    Prior to Admission medications   Medication Sig Start Date End Date Taking? Authorizing Provider  Prenatal Vit-Fe Fumarate-FA (PRENATAL VITAMIN) 27-0.8 MG TABS Take 1 tablet by mouth every morning. 09/03/16   Doug SouSam Jacubowitz, MD    Family History Family History  Problem Relation Age of Onset  . Cancer Mother   . Asthma Mother   . Cancer Father   . GER disease Brother     Social History Social History  Substance Use Topics  . Smoking status: Never Smoker  . Smokeless tobacco: Never Used  . Alcohol use No     Allergies   Tomato   Review of Systems Review of Systems  Constitutional: Negative for fever.    Gastrointestinal: Positive for abdominal pain. Negative for nausea and vomiting.  Genitourinary: Positive for frequency and vaginal discharge. Negative for dysuria and vaginal bleeding.  Musculoskeletal: Negative for back pain.  All other systems reviewed and are negative.    Physical Exam Updated Vital Signs BP 121/71 (BP Location: Left Arm)   Pulse 73   Temp 99.2 F (37.3 C) (Oral)   Resp 18   Ht 5\' 2"  (1.575 m)   Wt 162 lb 12.8 oz (73.8 kg)   LMP 08/02/2016   SpO2 100%   BMI 29.78 kg/m   Physical Exam  Constitutional: She is oriented to person, place, and time. She appears well-developed and well-nourished.  HENT:  Head: Normocephalic and atraumatic.  Right Ear: External ear normal.  Left Ear: External ear normal.  Nose: Nose normal.  Eyes: Right eye exhibits no discharge. Left eye exhibits no discharge.  Cardiovascular: Normal rate, regular rhythm and normal heart sounds.   Pulmonary/Chest: Effort normal and breath sounds normal.  Abdominal: Soft. There is tenderness in the suprapubic area. There is no CVA tenderness.  Genitourinary: Cervix exhibits no motion tenderness. Vaginal discharge (scant) found.  Neurological: She is alert and oriented to person, place, and time.  Skin: Skin is warm and dry.  Nursing note and vitals reviewed.    ED Treatments / Results  Labs (all labs ordered are listed, but only abnormal results are displayed) Labs Reviewed  WET PREP, GENITAL - Abnormal; Notable for the following:       Result Value   WBC, Wet Prep HPF POC MANY (*)    All other components within normal limits  PREGNANCY, URINE - Abnormal; Notable for the following:    Preg Test, Ur POSITIVE (*)    All other components within normal limits  URINALYSIS, ROUTINE W REFLEX MICROSCOPIC  GC/CHLAMYDIA PROBE AMP ([place]) NOT AT [facility]    EKG  EKG Interpretation None       Radiology No results found.  Procedures Procedures (including critical care  time)  EMERGENCY DEPARTMENT [place] PREGNANCY "Study: Limited Ultrasound of the Pelvis for Pregnancy"  INDICATIONS:Pregnancy(required) and Abdominal or pelvic pain Multiple views of the uterus and pelvic cavity were obtained in real-time with a multi-frequency probe.  APPROACH:Transabdominal  PERFORMED BY: Myself IMAGES ARCHIVED?: Yes LIMITATIONS: Body habitus PREGNANCY FREE FLUID: None ADNEXAL FINDINGS:Left ovary not seen and Right ovary not seen GESTATIONAL AGE, ESTIMATE: 10 weeks FETAL HEART RATE: Present INTERPRETATION: Fetal heart activity seen and IUP    Medications Ordered in ED Medications - No data to display   Initial Impression / Assessment and Plan / ED Course  I have reviewed the triage vital signs and the nursing notes.  Pertinent labs & imaging results that were available during my care of the patient were reviewed by me and considered in my medical decision making (see chart for details).  Clinical Course as of Sep 29 1001  Wed Sep 29, 2016  0840 Will check urine, do pelvic exam. Suspicion of torsion, appy, ectopic are all low, especially given prior IUP seen last month. Pain is mostly mildline  [SG]    Clinical Course User Index [SG] Pricilla Loveless, MD    Patient with mild midline lower abdominal pain. A pregnancy test had been ordered in triage although she is upset pregnant. No concerns for miscarriage at this time without bleeding. Bedside ultrasound by me shows an IUP with fetal heart rate. Patient will be referred back to her OB. No UTI. She has many white blood cells on wet prep but she has had this multiple times before. Just one month ago her gonorrhea/chlamydia was negative. Referred to OB. Discussed return precautions.  Final Clinical Impressions(s) / ED Diagnoses   Final diagnoses:  Abdominal pain during pregnancy in first trimester  Vaginal discharge    New Prescriptions New Prescriptions   No medications on file     Pricilla Loveless,  MD 09/29/16 1005

## 2016-09-30 LAB — GC/CHLAMYDIA PROBE AMP (~~LOC~~) NOT AT ARMC
Chlamydia: NEGATIVE
Neisseria Gonorrhea: NEGATIVE

## 2016-10-01 ENCOUNTER — Other Ambulatory Visit: Payer: BLUE CROSS/BLUE SHIELD

## 2016-10-04 LAB — OB RESULTS CONSOLE RPR: RPR: NONREACTIVE

## 2016-10-04 LAB — OB RESULTS CONSOLE GC/CHLAMYDIA
Chlamydia: NEGATIVE
GC PROBE AMP, GENITAL: NEGATIVE

## 2016-10-04 LAB — OB RESULTS CONSOLE RUBELLA ANTIBODY, IGM: Rubella: IMMUNE

## 2016-10-04 LAB — OB RESULTS CONSOLE HIV ANTIBODY (ROUTINE TESTING): HIV: NONREACTIVE

## 2016-10-04 LAB — OB RESULTS CONSOLE HEPATITIS B SURFACE ANTIGEN: Hepatitis B Surface Ag: NEGATIVE

## 2016-10-07 ENCOUNTER — Ambulatory Visit
Admission: RE | Admit: 2016-10-07 | Discharge: 2016-10-07 | Disposition: A | Discharge status: Home / Self Care | Payer: BLUE CROSS/BLUE SHIELD | Source: Ambulatory Visit | Attending: Obstetrics and Gynecology | Admitting: Obstetrics and Gynecology

## 2016-10-07 DIAGNOSIS — R1011 Right upper quadrant pain: Secondary | ICD-10-CM

## 2016-10-13 ENCOUNTER — Encounter (HOSPITAL_BASED_OUTPATIENT_CLINIC_OR_DEPARTMENT_OTHER): Payer: Self-pay | Admitting: Emergency Medicine

## 2016-10-13 ENCOUNTER — Emergency Department (HOSPITAL_BASED_OUTPATIENT_CLINIC_OR_DEPARTMENT_OTHER)
Admission: EM | Admit: 2016-10-13 | Discharge: 2016-10-13 | Disposition: A | Discharge status: Home / Self Care | Payer: BLUE CROSS/BLUE SHIELD | Attending: Dermatology | Admitting: Dermatology

## 2016-10-13 DIAGNOSIS — Z3A11 11 weeks gestation of pregnancy: Secondary | ICD-10-CM | POA: Insufficient documentation

## 2016-10-13 DIAGNOSIS — Z79899 Other long term (current) drug therapy: Secondary | ICD-10-CM | POA: Diagnosis not present

## 2016-10-13 DIAGNOSIS — Z5321 Procedure and treatment not carried out due to patient leaving prior to being seen by health care provider: Secondary | ICD-10-CM | POA: Insufficient documentation

## 2016-10-13 DIAGNOSIS — O209 Hemorrhage in early pregnancy, unspecified: Secondary | ICD-10-CM | POA: Insufficient documentation

## 2016-10-13 LAB — URINALYSIS, ROUTINE W REFLEX MICROSCOPIC
BILIRUBIN URINE: NEGATIVE
GLUCOSE, UA: NEGATIVE mg/dL
HGB URINE DIPSTICK: NEGATIVE
Ketones, ur: NEGATIVE mg/dL
Leukocytes, UA: NEGATIVE
Nitrite: NEGATIVE
Protein, ur: NEGATIVE mg/dL
SPECIFIC GRAVITY, URINE: 1.01 (ref 1.005–1.030)
pH: 6.5 (ref 5.0–8.0)

## 2016-10-13 NOTE — ED Triage Notes (Signed)
No answer when called for tx area 

## 2016-10-13 NOTE — ED Notes (Signed)
Pt called for a second time. Pt not seen in waiting room or in the bistro area.

## 2016-10-13 NOTE — ED Triage Notes (Signed)
Pt states she is [redacted] weeks pregnant. Pt noted scant amount of vaginal bleeding yesterday.  Pt also concerned that she was accidentally hit in abdomen today.  No current vaginal bleeding.

## 2016-10-13 NOTE — ED Notes (Signed)
No answer when called in ED WR and bistro lobby

## 2016-11-16 ENCOUNTER — Encounter (HOSPITAL_COMMUNITY): Payer: Self-pay

## 2016-11-16 ENCOUNTER — Inpatient Hospital Stay (HOSPITAL_COMMUNITY)
Admission: AD | Admit: 2016-11-16 | Discharge: 2016-11-16 | Disposition: A | Discharge status: Home / Self Care | Payer: BLUE CROSS/BLUE SHIELD | Source: Ambulatory Visit | Attending: Obstetrics and Gynecology | Admitting: Obstetrics and Gynecology

## 2016-11-16 DIAGNOSIS — O26852 Spotting complicating pregnancy, second trimester: Secondary | ICD-10-CM | POA: Insufficient documentation

## 2016-11-16 DIAGNOSIS — O4692 Antepartum hemorrhage, unspecified, second trimester: Secondary | ICD-10-CM

## 2016-11-16 DIAGNOSIS — Z3A16 16 weeks gestation of pregnancy: Secondary | ICD-10-CM | POA: Diagnosis not present

## 2016-11-16 LAB — URINALYSIS, ROUTINE W REFLEX MICROSCOPIC
Bilirubin Urine: NEGATIVE
GLUCOSE, UA: NEGATIVE mg/dL
Hgb urine dipstick: NEGATIVE
KETONES UR: NEGATIVE mg/dL
Leukocytes, UA: NEGATIVE
Nitrite: NEGATIVE
PROTEIN: NEGATIVE mg/dL
Specific Gravity, Urine: 1.002 — ABNORMAL LOW (ref 1.005–1.030)
pH: 6 (ref 5.0–8.0)

## 2016-11-16 NOTE — Discharge Instructions (Signed)
Pelvic Rest Pelvic rest may be recommended if:  Your placenta is partially or completely covering the opening of your cervix (placenta previa).  There is bleeding between the wall of the uterus and the amniotic sac in the first trimester of pregnancy (subchorionic hemorrhage).  You went into labor too early (preterm labor). Based on your overall health and the health of your baby, your health care provider will decide if pelvic rest is right for you. How do I rest my pelvis? For as long as told by your health care provider:  Do not have sex, sexual stimulation, or an orgasm.  Do not use tampons. Do not douche. Do not put anything in your vagina.  Do not lift anything that is heavier than 10 lb (4.5 kg).  Avoid activities that take a lot of effort (are strenuous).  Avoid any activity in which your pelvic muscles could become strained. When should I seek medical care? Seek medical care if you have:  Cramping pain in your lower abdomen.  Vaginal discharge.  A low, dull backache.  Regular contractions.  Uterine tightening. When should I seek immediate medical care? Seek immediate medical care if:  You have vaginal bleeding and you are pregnant. This information is not intended to replace advice given to you by your health care provider. Make sure you discuss any questions you have with your health care provider. Document Released: 10/30/2010 Document Revised: 12/11/2015 Document Reviewed: 01/06/2015 Elsevier Interactive Patient Education  2017 [employer]   Vaginal Bleeding During Pregnancy, Second Trimester A small amount of bleeding (spotting) from the vagina is relatively common in pregnancy. It usually stops on its own. Various things can cause bleeding or spotting in pregnancy. Some bleeding may be related to the pregnancy, and some may not. Sometimes the bleeding is normal and is not a problem. However, bleeding can also be a sign of something serious. Be sure to tell  your health care provider about any vaginal bleeding right away. Some possible causes of vaginal bleeding during the second trimester include:  Infection, inflammation, or growths on the cervix.  The placenta may be partially or completely covering the opening of the cervix inside the uterus (placenta previa).  The placenta may have separated from the uterus (abruption of the placenta).  You may be having early (preterm) labor.  The cervix may not be strong enough to keep a baby inside the uterus (cervical insufficiency).  Tiny cysts may have developed in the uterus instead of pregnancy tissue (molar pregnancy). Follow these instructions at home: Watch your condition for any changes. The following actions may help to lessen any discomfort you are feeling:  Follow your health care provider's instructions for limiting your activity. If your health care provider orders bed rest, you may need to stay in bed and only get up to use the bathroom. However, your health care provider may allow you to continue light activity.  If needed, make plans for someone to help with your regular activities and responsibilities while you are on bed rest.  Keep track of the number of pads you use each day, how often you change pads, and how soaked (saturated) they are. Write this down.  Do not use tampons. Do not douche.  Do not have sexual intercourse or orgasms until approved by your health care provider.  If you pass any tissue from your vagina, save the tissue so you can show it to your health care provider.  Only take over-the-counter or prescription medicines as directed  by your health care provider.  Do not take aspirin because it can make you bleed.  Do not exercise or perform any strenuous activities or heavy lifting without your health care provider's permission.  Keep all follow-up appointments as directed by your health care provider. Contact a health care provider if:  You have any  vaginal bleeding during any part of your pregnancy.  You have cramps or labor pains.  You have a fever, not controlled by medicine. Get help right away if:  You have severe cramps in your back or belly (abdomen).  You have contractions.  You have chills.  You pass large clots or tissue from your vagina.  Your bleeding increases.  You feel light-headed or weak, or you have fainting episodes.  You are leaking fluid or have a gush of fluid from your vagina. This information is not intended to replace advice given to you by your health care provider. Make sure you discuss any questions you have with your health care provider. Document Released: 04/14/2005 Document Revised: 12/11/2015 Document Reviewed: 03/12/2013 Elsevier Interactive Patient Education  2017 [employer].

## 2016-11-16 NOTE — MAU Provider Note (Signed)
Chief Complaint:  preg with spotting   First Provider Initiated Contact with Patient 11/16/16 1752   HPI: Megan Pena is a 31 y.o. G3P0020 at 65w0dwho presents to maternity admissions reporting bleeding today.  States had red/brown blood after work when in bathroom.  No cramping.  Worried it is a miscarriage. She denies LOF, vaginal bleeding, vaginal itching/burning, urinary symptoms, h/a, dizziness, n/v, diarrhea, constipation or fever/chills.      Vaginal Bleeding  The patient's primary symptoms include vaginal bleeding. The patient's pertinent negatives include no genital itching, genital lesions, genital odor, pelvic pain or vaginal discharge. This is a new problem. The current episode started today. The problem occurs rarely. The problem has been resolved. The patient is experiencing no pain. She is pregnant. Pertinent negatives include no abdominal pain, back pain, chills, constipation, diarrhea, dysuria, fever, nausea or vomiting. The vaginal discharge was bloody. The vaginal bleeding is lighter than menses. She has not been passing clots. She has not been passing tissue. Nothing aggravates the symptoms. She has tried nothing for the symptoms.     Past Medical History: Past Medical History:  Diagnosis Date  . Eczema   . Headache(784.0)   . Herpes     Past obstetric history: OB History  Gravida Para Term Preterm AB Living  3       2 0  SAB TAB Ectopic Multiple Live Births  1 1          # Outcome Date GA Lbr Len/2nd Weight Sex Delivery Anes PTL Lv  3 Current           2 SAB 2015             Birth Comments: D&C for missed AB  1 TAB               Past Surgical History: Past Surgical History:  Procedure Laterality Date  . DILATION AND CURETTAGE OF UTERUS    . KNEE SURGERY      Family History: Family History  Problem Relation Age of Onset  . Cancer Mother   . Asthma Mother   . Cancer Father   . GER disease Brother     Social History: Social History  Substance  Use Topics  . Smoking status: Never Smoker  . Smokeless tobacco: Never Used  . Alcohol use No    Allergies:  Allergies  Allergen Reactions  . Tomato Other (See Comments)    acne    Meds:  Prescriptions Prior to Admission  Medication Sig Dispense Refill Last Dose  . Prenatal Vit-Fe Fumarate-FA (PRENATAL VITAMIN) 27-0.8 MG TABS Take 1 tablet by mouth every morning. 30 tablet 0 11/16/2016 at Unknown time    I have reviewed patient's Past Medical Hx, Surgical Hx, Family Hx, Social Hx, medications and allergies.   ROS:  Review of Systems  Constitutional: Negative for chills and fever.  Gastrointestinal: Negative for abdominal pain, constipation, diarrhea, nausea and vomiting.  Genitourinary: Positive for vaginal bleeding. Negative for dysuria, pelvic pain and vaginal discharge.  Musculoskeletal: Negative for back pain.   Other systems negative  Physical Exam  Patient Vitals for the past 24 hrs:  BP Temp Temp src Pulse Resp SpO2 Height  11/16/16 1741 112/69 98.8 F (37.1 C) Oral 90 17 94 %  (1.575 m)   Constitutional: Well-developed, well-nourished female in no acute distress.  Cardiovascular: normal rate and rhythm Respiratory: normal effort, clear to auscultation bilaterally GI: Abd soft, non-tender, gravid appropriate for gestational age.  No rebound or guarding. MS: Extremities nontender, no edema, normal ROM Neurologic: Alert and oriented x 4.  GU: Neg CVAT.  PELVIC EXAM: Cervix pink, visually closed, without lesion, moderate thick white creamy discharge, vaginal walls and external genitalia normal       NO SIGN OF BLOOD IN VAGINA OR PERINEUM Bimanual exam: Cervix firm, posterior, neg CMT, uterus nontender, Fundal Height consistent with dates, adnexa without tenderness, enlargement, or mass    Labs: Results for orders placed or performed during the hospital encounter of 11/16/16 (from the past 24 hour(s))  Urinalysis, Routine w reflex microscopic     Status:  Abnormal   Collection Time: 11/16/16  5:30 PM  Result Value Ref Range   Color, Urine STRAW (A) YELLOW   APPearance CLEAR CLEAR   Specific Gravity, Urine 1.002 (L) 1.005 - 1.030   pH 6.0 5.0 - 8.0   Glucose, UA NEGATIVE NEGATIVE mg/dL   Hgb urine dipstick NEGATIVE NEGATIVE   Bilirubin Urine NEGATIVE NEGATIVE   Ketones, ur NEGATIVE NEGATIVE mg/dL   Protein, ur NEGATIVE NEGATIVE mg/dL   Nitrite NEGATIVE NEGATIVE   Leukocytes, UA NEGATIVE NEGATIVE      Imaging:  Informal bedside US done for reassurance Fetus is active FHR 160s Normal amniotic fluid Anterior placenta appears normal  MAU Course/MDM: Consult Dr Billy Coast with presentation, exam findings and test results.  Treatments in MAU included none.    Assessment: Single intrauterine pregnancy at [redacted]w[redacted]d Reported vaginal bleeding, no evidence of current bleeding Reassuring fetal status   Plan: Discharge home Bleeding precautions Follow up in Office for prenatal visits and recheck of status  Encouraged to return here or to other Urgent Care/ED if she develops worsening of symptoms, increase in pain, fever, or other concerning symptoms.   Pt stable at time of discharge.  Wynelle Bourgeois CNM, MSN Certified Nurse-Midwife 11/16/2016 6:34 PM

## 2017-01-21 ENCOUNTER — Encounter (HOSPITAL_COMMUNITY): Payer: Self-pay

## 2017-01-21 ENCOUNTER — Inpatient Hospital Stay (HOSPITAL_COMMUNITY)
Admission: AD | Admit: 2017-01-21 | Discharge: 2017-01-21 | Disposition: A | Discharge status: Home / Self Care | Payer: BLUE CROSS/BLUE SHIELD | Source: Ambulatory Visit | Attending: Obstetrics and Gynecology | Admitting: Obstetrics and Gynecology

## 2017-01-21 DIAGNOSIS — R102 Pelvic and perineal pain: Secondary | ICD-10-CM | POA: Diagnosis not present

## 2017-01-21 DIAGNOSIS — O26893 Other specified pregnancy related conditions, third trimester: Secondary | ICD-10-CM | POA: Insufficient documentation

## 2017-01-21 DIAGNOSIS — N949 Unspecified condition associated with female genital organs and menstrual cycle: Secondary | ICD-10-CM

## 2017-01-21 DIAGNOSIS — Z3A25 25 weeks gestation of pregnancy: Secondary | ICD-10-CM | POA: Diagnosis not present

## 2017-01-21 DIAGNOSIS — R1010 Upper abdominal pain, unspecified: Secondary | ICD-10-CM | POA: Diagnosis present

## 2017-01-21 LAB — URINALYSIS, ROUTINE W REFLEX MICROSCOPIC
Bilirubin Urine: NEGATIVE
Glucose, UA: NEGATIVE mg/dL
HGB URINE DIPSTICK: NEGATIVE
KETONES UR: NEGATIVE mg/dL
LEUKOCYTES UA: NEGATIVE
Nitrite: NEGATIVE
Protein, ur: NEGATIVE mg/dL
Specific Gravity, Urine: 1.005 (ref 1.005–1.030)
pH: 7 (ref 5.0–8.0)

## 2017-01-21 LAB — WET PREP, GENITAL
Clue Cells Wet Prep HPF POC: NONE SEEN
Sperm: NONE SEEN
Trich, Wet Prep: NONE SEEN
YEAST WET PREP: NONE SEEN

## 2017-01-21 LAB — AMNISURE RUPTURE OF MEMBRANE (ROM) NOT AT ARMC: Amnisure ROM: NEGATIVE

## 2017-01-21 NOTE — MAU Note (Signed)
Feeling some pulling on lower left and rt side, some pain in LUQ. Had some fluid leaking , day before yesterday- just came out of nowhere, ran down her leg- one time, none since.

## 2017-01-21 NOTE — MAU Note (Signed)
This RN at bedside holding cardio in place.

## 2017-01-21 NOTE — MAU Note (Signed)
Notified Dr. Cherly Hensenousins patient very difficult to trace FHR, tachy at 175, patient also having irregular contractions, had leaking day before yesterday, none today, MD to notify Dr. Amado NashAlmquist to see if she will evaluate patient in MAU.

## 2017-01-21 NOTE — MAU Note (Signed)
Dr. Amado NashAlmquist at bedside.

## 2017-01-21 NOTE — Discharge Instructions (Signed)

## 2017-01-21 NOTE — MAU Note (Signed)
Urine in lab 

## 2017-01-21 NOTE — MAU Note (Signed)
Attempts to trace fhr by 3 RN and 1 CNM very difficult, constant fetal movement.

## 2017-01-21 NOTE — H&P (Signed)
OB ADMISSION/ HISTORY & PHYSICAL:  Admission Date: 01/21/2017  2:30 PM  Eval Diagnosis: round lig pain  Megan Pena is a 31 y.o. female G3P0020 at 2725 3/7 presenting for c/o right abdominal tugging, left upper abdominal pain, and fluid running down her leg 2 days ago.  She states fluid seemed clear, no odor, and has not happened again.  Denies vaginal discharge, odor or other associated c/o.  She also notes a tugging that is dull and constant in her right side, upper abdomen.  Says worse with ambulating, but doesn't go away.  Has had this for a week - hasn't gotten any worse.  Pt is vague and confusing about her left side pain - inconsistent on how long it has been there then later said that she doesn't have it now.  Pt has been off work this week as she is in [employer], has not been very active per pt.  No contractions, vaginal bleeding, +FM>  Prenatal History: G3P0020   EDC : 05/03/2017, by Last Menstrual Period  [facility]: Cherly Hensenousins Prenatal course complicated by uncompliacated  Prenatal Labs: ABO, Rh:  pos Antibody:   Rubella:    RPR: Non Reactive (02/16 0857)  HBsAg:    HIV: Non Reactive (02/16 0857)  GTT: pending GBS:     Medical / Surgical History :  Past medical history:  Past Medical History:  Diagnosis Date  . Eczema   . Headache(784.0)   . Herpes      Past surgical history:  Past Surgical History:  Procedure Laterality Date  . DILATION AND CURETTAGE OF UTERUS    . KNEE SURGERY      Family History:  Family History  Problem Relation Age of Onset  . Cancer Mother   . Asthma Mother   . Cancer Father   . GER disease Brother      Social History:  reports that she has never smoked. She has never used smokeless tobacco. She reports that she does not drink alcohol or use drugs.   Allergies: Tomato    Current Medications at time of admission:  Prior to Admission medications   Medication Sig Start Date End Date Taking?  Authorizing Provider  Prenatal Vit-Fe Fumarate-FA (PRENATAL VITAMIN) 27-0.8 MG TABS Take 1 tablet by mouth every morning. 09/03/16   Doug SouJacubowitz, Sam, MD     Review of Systems: See above   Physical Exam:  VS: Blood pressure 114/69, pulse 99, temperature 98.5 F (36.9 C), temperature source Oral, resp. rate 18, weight 161 lb 12 oz (73.4 kg), last menstrual period 07/27/2016, SpO2 97 %, unknown if currently breastfeeding.  General: alert and oriented, appears comfortable, laughing and moving around w/o discomfort Heart: RRR Lungs: Clear lung fields Abdomen: Gravid, soft and non-tender, non-distended / uterus: gravid; unable to reproduce pain/tugging Extremities: no edema, nt bilat LE  Genitalia / VE: Dilation: Closed Exam by:: Dr. Amado NashAlmquist  Sse: no pooling or fluid noted, scant white discharge Cx: cl/th/high  FHR: baseline rate 150s / variability normal / accelerations present / no decelerations TOCO: no contractions  Wet prep neg amnisure neg  Assessment:  iup at 25 3 1. Round ligament pain  Plan:  1. Ok for d/c home; encourage rest if discomfort increases; plan f/u in 1 wk; membranes intact 2. Fetal status reassuring

## 2017-02-18 ENCOUNTER — Encounter (HOSPITAL_COMMUNITY): Payer: Self-pay | Admitting: Emergency Medicine

## 2017-02-18 ENCOUNTER — Emergency Department (HOSPITAL_COMMUNITY)
Admission: EM | Admit: 2017-02-18 | Discharge: 2017-02-18 | Disposition: A | Discharge status: Home / Self Care | Payer: BLUE CROSS/BLUE SHIELD | Attending: Emergency Medicine | Admitting: Emergency Medicine

## 2017-02-18 DIAGNOSIS — O9989 Other specified diseases and conditions complicating pregnancy, childbirth and the puerperium: Secondary | ICD-10-CM | POA: Insufficient documentation

## 2017-02-18 DIAGNOSIS — M5432 Sciatica, left side: Secondary | ICD-10-CM | POA: Insufficient documentation

## 2017-02-18 DIAGNOSIS — M5431 Sciatica, right side: Secondary | ICD-10-CM | POA: Insufficient documentation

## 2017-02-18 DIAGNOSIS — M791 Myalgia: Secondary | ICD-10-CM | POA: Insufficient documentation

## 2017-02-18 DIAGNOSIS — M7918 Myalgia, other site: Secondary | ICD-10-CM

## 2017-02-18 LAB — URINALYSIS, ROUTINE W REFLEX MICROSCOPIC
Bilirubin Urine: NEGATIVE
Glucose, UA: 150 mg/dL — AB
Hgb urine dipstick: NEGATIVE
Ketones, ur: 5 mg/dL — AB
LEUKOCYTES UA: NEGATIVE
NITRITE: NEGATIVE
PROTEIN: NEGATIVE mg/dL
Specific Gravity, Urine: 1.023 (ref 1.005–1.030)
pH: 6 (ref 5.0–8.0)

## 2017-02-18 LAB — CBG MONITORING, ED: GLUCOSE-CAPILLARY: 79 mg/dL (ref 65–99)

## 2017-02-18 MED ORDER — ACETAMINOPHEN 325 MG PO TABS
650.0000 mg | ORAL_TABLET | Freq: Once | ORAL | Status: AC
  Administered 2017-02-18: 650 mg via ORAL
  Filled 2017-02-18: qty 2

## 2017-02-18 MED ORDER — CYCLOBENZAPRINE HCL 10 MG PO TABS: 10.0000 mg | ORAL_TABLET | Freq: Three times a day (TID) | ORAL | 0 refills | Status: DC | PRN

## 2017-02-18 NOTE — Discharge Instructions (Signed)
Take tylenol as needed for pain and use flexeril as directed as needed for muscle spasms/relaxation. Do not drive or operate machinery with muscle relaxant use. Use heat to areas of soreness, no more than 20 minutes at a time every hour. Avoid heavy lifting or repetitive bending/twisting. Use a cushion on your chair to help alleviate the strain on your bottom. Try to rest periodically to avoid prolonged standing. Stay well hydrated, get plenty of rest. You need to follow up with your OBGYN regarding the slight amount of sugar in your urine. Follow up with your OBGYN/primary care physician in 5-7 days for recheck of symptoms. Return to ER for emergent changing or worsening of symptoms.

## 2017-02-18 NOTE — ED Notes (Signed)
PA at bedside.

## 2017-02-18 NOTE — ED Provider Notes (Signed)
[facility]-EMERGENCY DEPT Provider Note   CSN: [id number] Arrival date & time: 02/18/17  1359     History   Chief Complaint Chief Complaint  Patient presents with  . Leg Pain  . Routine Prenatal Visit    HPI Megan Pena is a 31 y.o. G81P0020 female with a PMHx of eczema, headaches, and herpes, currently [redacted]wks pregnant, who presents to the ED with complaints of b/l buttock pain that she states "feels like spasms or sciatica". Patient states that she stands for prolonged periods of time at work and occasionally has to pull herself up in order to visualize what she is working on, and believes this has caused her symptoms. She states that it started with left buttock pain but has now gone to the right side and currently the right side is the only side that is hurting. She describes the pain is 9/10 constant dull achy spasmy nonradiating bilateral buttock pain that worsens with walking and with no treatments tried prior to arrival. She denies any heavy lifting. She also mentions that she has had intermittent upper abdominal pain since getting pregnant, but she was seen at her OB/GYN's office on Tuesday 3 days ago and was told that everything was fine. Of note, chart review reveals [facility] for c/o upper abd pain and clear fluid running down legs; pelvic exam unremarkable, wet prep negative and amnisure negative, U/A unremarkable; dx of round ligament pain, advised pelvic rest. She states that nothing has changed with her intermittent abd pain, and she doesn't have any ongoing abd pain at this time. She endorses having +FM, denies LOF or vaginal bleeding.  She denies fevers, chills, CP, SOB, current abd pain, N/V/D/C, hematuria, dysuria, incontinence of urine/stool, saddle anesthesia/cauda equina symptoms, back pain, vaginal bleeding/discharge, arthralgias, numbness, tingling, focal weakness, or any other complaints at this time. Denies hx of IVDU or CA.     The history is  provided by the patient and medical records. No language interpreter was used.  Leg Pain   This is a new problem. The current episode started more than 2 days ago. The problem occurs constantly. The problem has not changed since onset.Pain location: b/l buttocks. The quality of the pain is described as aching and dull (spasmy). The pain is at a severity of 9/10. The pain is moderate. Pertinent negatives include no numbness, full range of motion and no tingling. The symptoms are aggravated by standing. She has tried nothing for the symptoms. The treatment provided no relief. There has been no history of extremity trauma.    Past Medical History:  Diagnosis Date  . Eczema   . Headache(784.0)   . Herpes     There are no active problems to display for this patient.   Past Surgical History:  Procedure Laterality Date  . DILATION AND CURETTAGE OF UTERUS    . KNEE SURGERY      OB History    Gravida Para Term Preterm AB Living   3       2 0   SAB TAB Ectopic Multiple Live Births   1 1             Home Medications    Prior to Admission medications   Medication Sig Start Date End Date Taking? Authorizing Provider  Prenatal Vit-Fe Fumarate-FA (PRENATAL VITAMIN) 27-0.8 MG TABS Take 1 tablet by mouth every morning. 09/03/16   Doug Sou, MD    Family History Family History  Problem  Relation Age of Onset  . Cancer Mother   . Asthma Mother   . Cancer Father   . GER disease Brother     Social History Social History  Substance Use Topics  . Smoking status: Never Smoker  . Smokeless tobacco: Never Used  . Alcohol use No     Allergies   Tomato   Review of Systems Review of Systems  Constitutional: Negative for chills and fever.  Respiratory: Negative for shortness of breath.   Cardiovascular: Negative for chest pain.  Gastrointestinal: Negative for abdominal pain, constipation, diarrhea, nausea and vomiting.  Genitourinary: Negative for difficulty urinating (no  incontinence), dysuria, hematuria, vaginal bleeding and vaginal discharge.  Musculoskeletal: Positive for myalgias (b/l buttock). Negative for arthralgias and back pain.  Skin: Negative for color change.  Allergic/Immunologic: Negative for immunocompromised state.  Neurological: Negative for tingling, weakness and numbness.  Psychiatric/Behavioral: Negative for confusion.   All other systems reviewed and are negative for acute change except as noted in the HPI.    Physical Exam Updated Vital Signs BP 113/72   Pulse 93   Temp 98.2 F (36.8 C) (Oral)   Resp 16   LMP 07/27/2016   SpO2 94%   Physical Exam  Constitutional: She is oriented to person, place, and time. Vital signs are normal. She appears well-developed and well-nourished.  Non-toxic appearance. No distress.  Afebrile, nontoxic, NAD  HENT:  Head: Normocephalic and atraumatic.  Mouth/Throat: Oropharynx is clear and moist and mucous membranes are normal.  Eyes: Conjunctivae and EOM are normal. Right eye exhibits no discharge. Left eye exhibits no discharge.  Neck: Normal range of motion. Neck supple.  Cardiovascular: Normal rate, regular rhythm, normal heart sounds and intact distal pulses.  Exam reveals no gallop and no friction rub.   No murmur heard. Pulmonary/Chest: Effort normal and breath sounds normal. No respiratory distress. She has no decreased breath sounds. She has no wheezes. She has no rhonchi. She has no rales.  Abdominal: Soft. Normal appearance and bowel sounds are normal. She exhibits no distension. There is no tenderness. There is no rigidity, no rebound, no guarding, no CVA tenderness, no tenderness at McBurney's point and negative Murphy's sign.  Gravid abdomen Soft, +BS throughout, nontender, no r/g/r, neg murphy's, neg mcburney's, no CVA TTP   Musculoskeletal: Normal range of motion.       Lumbar back: Normal.       Back:  Lumbosacral spine with FROM intact without spinous process TTP, no bony  stepoffs or deformities, with mild R sided buttock TTP over the ischial tuberosity region, no L buttock TTP, and no paraspinous muscle TTP or muscle spasms throughout remainder of lumbosacral spinal area. Strength and sensation grossly intact in all extremities, negative SLR bilaterally, gait steady and nonantalgic. No overlying skin changes. Distal pulses intact.   Neurological: She is alert and oriented to person, place, and time. She has normal strength. No sensory deficit.  Skin: Skin is warm, dry and intact. No rash noted.  Psychiatric: She has a normal mood and affect.  Nursing note and vitals reviewed.    ED Treatments / Results  Labs (all labs ordered are listed, but only abnormal results are displayed) Labs Reviewed  URINALYSIS, ROUTINE W REFLEX MICROSCOPIC - Abnormal; Notable for the following:       Result Value   APPearance HAZY (*)    Glucose, UA 150 (*)    Ketones, ur 5 (*)    All other components within normal limits  CBG  MONITORING, ED    EKG  EKG Interpretation None       Radiology No results found.  Procedures Procedures (including critical care time)  Medications Ordered in ED Medications  acetaminophen (TYLENOL) tablet 650 mg (650 mg Oral Given 02/18/17 1645)     Initial Impression / Assessment and Plan / ED Course  I have reviewed the triage vital signs and the nursing notes.  Pertinent labs & imaging results that were available during my care of the patient were reviewed by me and considered in my medical decision making (see chart for details).     31 y.o. female currently [redacted]wks pregnant (G3P0020) who c/o b/l buttock pain R>L x3 days. States it feels like a spasm or sciatica. Denies back pain. On exam, focal tenderness over R ischial tuberosity area but no focal tenderness on L buttock area, no focal midline spinal or paraspinous muscle TTP, neg SLR bilaterally, extremities NVI with soft compartments and ambulatory with steady gait. Also mentions  that she's had intermittent upper abd pain since getting pregnant, has been seen already for this including at the [facility] on 01/21/17 and at her OBGYNs 3 days ago, and has been told it's round ligament pain. No ongoing abd pain today. No abd tenderness on exam. Will get U/A to ensure no occult infection to cause this buttock pain, but doubt need for further work up; likely sciatica vs ischial tuberosity pain from work related physical stressors. Will give tylenol but hold off on flexeril since she's driving, then reassess shortly.   7:05 PM U/A with 150 glucose and 5 ketones, but no other findings, no evidence of UTI. CBG done and was 79, so doubt need for further emergent work up of urinary glucose. Advised she stay hydrated and f/up with OBGYN/PCP in 5-7 days for recheck of that and recheck of today's symptoms. Today's symptoms likely related to sciatica vs musculoskeletal etiology. Advised use of tylenol, taking breaks from prolonged standing, cushion in chair, heat use, and rx flexeril for spasms. F/up with OBGYN/PCP in 5-7 days for recheck. I explained the diagnosis and have given explicit precautions to return to the ER including for any other new or worsening symptoms. The patient understands and accepts the medical plan as it's been dictated and I have answered their questions. Discharge instructions concerning home care and prescriptions have been given. The patient is STABLE and is discharged to home in good condition.    Final Clinical Impressions(s) / ED Diagnoses   Final diagnoses:  Buttock pain  Bilateral sciatica    New Prescriptions New Prescriptions   CYCLOBENZAPRINE (FLEXERIL) 10 MG TABLET    Take 1 tablet (10 mg total) by mouth 3 (three) times daily as needed for muscle spasms.     [address], [place], [place] 02/18/17 1909    Nira Connardama, Pedro Eduardo, MD 02/19/17 [phone]

## 2017-02-18 NOTE — ED Triage Notes (Signed)
Pt reports intermittent buttock pain for the past few days. Pain alternates between L and R side. Worse after standing for long periods of time.  Pt [redacted] weeks pregnant. Pt also reports intermittent abd pain since she became pregnant. Denies abd pain at present.

## 2017-03-07 ENCOUNTER — Encounter (HOSPITAL_COMMUNITY): Payer: Self-pay | Admitting: Emergency Medicine

## 2017-03-07 ENCOUNTER — Inpatient Hospital Stay (HOSPITAL_COMMUNITY)
Admission: AD | Admit: 2017-03-07 | Discharge: 2017-03-11 | DRG: 778 | Disposition: A | Discharge status: Home / Self Care | Payer: BLUE CROSS/BLUE SHIELD | Source: Ambulatory Visit | Attending: Obstetrics and Gynecology | Admitting: Obstetrics and Gynecology

## 2017-03-07 DIAGNOSIS — O4700 False labor before 37 completed weeks of gestation, unspecified trimester: Secondary | ICD-10-CM

## 2017-03-07 DIAGNOSIS — Z3490 Encounter for supervision of normal pregnancy, unspecified, unspecified trimester: Secondary | ICD-10-CM

## 2017-03-07 DIAGNOSIS — O288 Other abnormal findings on antenatal screening of mother: Secondary | ICD-10-CM

## 2017-03-07 DIAGNOSIS — Z3493 Encounter for supervision of normal pregnancy, unspecified, third trimester: Secondary | ICD-10-CM

## 2017-03-07 DIAGNOSIS — O26893 Other specified pregnancy related conditions, third trimester: Secondary | ICD-10-CM | POA: Diagnosis present

## 2017-03-07 DIAGNOSIS — O4703 False labor before 37 completed weeks of gestation, third trimester: Secondary | ICD-10-CM

## 2017-03-07 DIAGNOSIS — O368390 Maternal care for abnormalities of the fetal heart rate or rhythm, unspecified trimester, not applicable or unspecified: Secondary | ICD-10-CM

## 2017-03-07 DIAGNOSIS — Y92414 Local residential or business street as the place of occurrence of the external cause: Secondary | ICD-10-CM

## 2017-03-07 DIAGNOSIS — T07XXXA Unspecified multiple injuries, initial encounter: Secondary | ICD-10-CM

## 2017-03-07 DIAGNOSIS — O365930 Maternal care for other known or suspected poor fetal growth, third trimester, not applicable or unspecified: Secondary | ICD-10-CM

## 2017-03-07 DIAGNOSIS — M791 Myalgia: Secondary | ICD-10-CM | POA: Diagnosis present

## 2017-03-07 DIAGNOSIS — O283 Abnormal ultrasonic finding on antenatal screening of mother: Secondary | ICD-10-CM

## 2017-03-07 DIAGNOSIS — I4949 Other premature depolarization: Secondary | ICD-10-CM

## 2017-03-07 DIAGNOSIS — O9A213 Injury, poisoning and certain other consequences of external causes complicating pregnancy, third trimester: Secondary | ICD-10-CM

## 2017-03-07 DIAGNOSIS — Z3A32 32 weeks gestation of pregnancy: Secondary | ICD-10-CM

## 2017-03-07 HISTORY — DX: Other specified health status: Z78.9

## 2017-03-07 MED ORDER — SODIUM CHLORIDE 0.9 % IV BOLUS (SEPSIS)
1000.0000 mL | Freq: Once | INTRAVENOUS | Status: AC
  Administered 2017-03-08: 1000 mL via INTRAVENOUS

## 2017-03-07 MED ORDER — ACETAMINOPHEN 500 MG PO TABS
1000.0000 mg | ORAL_TABLET | Freq: Once | ORAL | Status: AC
  Administered 2017-03-07: 1000 mg via ORAL
  Filled 2017-03-07: qty 2
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
## 2017-03-08 ENCOUNTER — Inpatient Hospital Stay (HOSPITAL_COMMUNITY): Payer: BLUE CROSS/BLUE SHIELD

## 2017-03-08 ENCOUNTER — Encounter (HOSPITAL_COMMUNITY): Payer: Self-pay | Admitting: *Deleted

## 2017-03-08 ENCOUNTER — Emergency Department (HOSPITAL_COMMUNITY): Payer: BLUE CROSS/BLUE SHIELD

## 2017-03-08 DIAGNOSIS — O4700 False labor before 37 completed weeks of gestation, unspecified trimester: Secondary | ICD-10-CM | POA: Diagnosis present

## 2017-03-08 DIAGNOSIS — Y92414 Local residential or business street as the place of occurrence of the external cause: Secondary | ICD-10-CM | POA: Diagnosis not present

## 2017-03-08 DIAGNOSIS — M791 Myalgia: Secondary | ICD-10-CM | POA: Diagnosis present

## 2017-03-08 DIAGNOSIS — O26893 Other specified pregnancy related conditions, third trimester: Secondary | ICD-10-CM | POA: Diagnosis not present

## 2017-03-08 DIAGNOSIS — Z3A32 32 weeks gestation of pregnancy: Secondary | ICD-10-CM | POA: Diagnosis not present

## 2017-03-08 DIAGNOSIS — O365930 Maternal care for other known or suspected poor fetal growth, third trimester, not applicable or unspecified: Secondary | ICD-10-CM | POA: Diagnosis present

## 2017-03-08 LAB — OB RESULTS CONSOLE ANTIBODY SCREEN: ANTIBODY SCREEN: NEGATIVE

## 2017-03-08 LAB — CBC
HCT: 28.3 % — ABNORMAL LOW (ref 36.0–46.0)
HEMOGLOBIN: 10.3 g/dL — AB (ref 12.0–15.0)
MCH: 28.9 pg (ref 26.0–34.0)
MCHC: 36.4 g/dL — ABNORMAL HIGH (ref 30.0–36.0)
MCV: 79.5 fL (ref 78.0–100.0)
Platelets: 230 10*3/uL (ref 150–400)
RBC: 3.56 MIL/uL — AB (ref 3.87–5.11)
RDW: 15.3 % (ref 11.5–15.5)
WBC: 6.1 10*3/uL (ref 4.0–10.5)

## 2017-03-08 LAB — RPR: RPR Ser Ql: NONREACTIVE

## 2017-03-08 LAB — GROUP B STREP BY PCR: GROUP B STREP BY PCR: NEGATIVE

## 2017-03-08 LAB — ABO/RH: ABO/RH(D): O POS

## 2017-03-08 LAB — OB RESULTS CONSOLE ABO/RH: RH TYPE: POSITIVE

## 2017-03-08 LAB — MAGNESIUM: MAGNESIUM: 4.6 mg/dL — AB (ref 1.7–2.4)

## 2017-03-08 LAB — OB RESULTS CONSOLE GBS: STREP GROUP B AG: NEGATIVE

## 2017-03-08 LAB — TYPE AND SCREEN
ABO/RH(D): O POS
Antibody Screen: NEGATIVE

## 2017-03-08 MED ORDER — CALCIUM CARBONATE ANTACID 500 MG PO CHEW: 2.0000 | CHEWABLE_TABLET | ORAL | Status: DC | PRN

## 2017-03-08 MED ORDER — ACETAMINOPHEN 325 MG PO TABS
650.0000 mg | ORAL_TABLET | ORAL | Status: DC | PRN
  Administered 2017-03-09 (×2): 650 mg via ORAL
  Filled 2017-03-08 (×2): qty 2

## 2017-03-08 MED ORDER — LACTATED RINGERS IV SOLN
INTRAVENOUS | Status: DC
  Administered 2017-03-08 (×2): via INTRAVENOUS
  Administered 2017-03-10: 125 mL/h via INTRAVENOUS

## 2017-03-08 MED ORDER — DOCUSATE SODIUM 100 MG PO CAPS
100.0000 mg | ORAL_CAPSULE | Freq: Every day | ORAL | Status: DC
  Administered 2017-03-08 – 2017-03-09 (×2): 100 mg via ORAL
  Filled 2017-03-08 (×3): qty 1

## 2017-03-08 MED ORDER — BETAMETHASONE SOD PHOS & ACET 6 (3-3) MG/ML IJ SUSP
12.0000 mg | INTRAMUSCULAR | Status: AC
  Administered 2017-03-08 – 2017-03-09 (×2): 12 mg via INTRAMUSCULAR
  Filled 2017-03-08 (×3): qty 2

## 2017-03-08 MED ORDER — ZOLPIDEM TARTRATE 5 MG PO TABS: 5.0000 mg | ORAL_TABLET | Freq: Every evening | ORAL | Status: DC | PRN

## 2017-03-08 MED ORDER — CYCLOBENZAPRINE HCL 10 MG PO TABS
10.0000 mg | ORAL_TABLET | Freq: Three times a day (TID) | ORAL | Status: DC | PRN
  Administered 2017-03-08 – 2017-03-09 (×3): 10 mg via ORAL
  Filled 2017-03-08 (×5): qty 1

## 2017-03-08 MED ORDER — MAGNESIUM SULFATE 40 G IN LACTATED RINGERS - SIMPLE
2.0000 g/h | INTRAVENOUS | Status: DC
  Administered 2017-03-08: 2 g/h via INTRAVENOUS
  Filled 2017-03-08: qty 500
  Filled 2017-03-08: qty 40

## 2017-03-08 MED ORDER — PRENATAL MULTIVITAMIN CH
1.0000 | ORAL_TABLET | Freq: Every day | ORAL | Status: DC
  Administered 2017-03-08 – 2017-03-10 (×3): 1 via ORAL
  Filled 2017-03-08 (×3): qty 1

## 2017-03-08 MED ORDER — MAGNESIUM SULFATE BOLUS VIA INFUSION
4.0000 g | Freq: Once | INTRAVENOUS | Status: AC
  Administered 2017-03-08: 4 g via INTRAVENOUS
  Filled 2017-03-08: qty 500

## 2017-03-08 NOTE — ED Notes (Signed)
Pt to xray

## 2017-03-08 NOTE — ED Notes (Signed)
Pt going to rm 175 on L&D.
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
## 2017-03-08 NOTE — ED Notes (Signed)
[facility] arrived  
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
## 2017-03-08 NOTE — ED Notes (Signed)
[facility] contacted for tx to [facility] hospital, Dr. Dereck Ligas receiving, Rm [id number]

## 2017-03-09 ENCOUNTER — Inpatient Hospital Stay (HOSPITAL_COMMUNITY): Payer: BLUE CROSS/BLUE SHIELD
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
## 2017-03-10 MED ORDER — NIFEDIPINE 10 MG PO CAPS
10.0000 mg | ORAL_CAPSULE | Freq: Once | ORAL | Status: AC
  Administered 2017-03-10: 10 mg via ORAL
  Filled 2017-03-10: qty 1

## 2017-03-10 NOTE — Progress Notes (Signed)
HD #3 32 2/7 weeks  S: c/o sore left side with lying down (+) FM  denies CTX  O: BP (!) 106/44 (BP Location: Right Arm)   Pulse 69   Temp 98.7 F (37.1 C) (Oral)   Resp 18   Ht 5\' 2"  (1.575 m)   Wt 79.2 kg (174 lb 8 oz)   SpO2 97%   BMI 31.92 kg/m   Lungs clear to A Cor RRR Abd; gravid soft nontender Ext no edema or calf tenderness  VE ft/90/-2   Tracing: baseline 160 small variables Ctx none( resolved after one dose procardia GBS cx neg  IMP: PTL s/p magnesium, BMZ complete SGA Decelerations( small variables) P)remain inpt due to two decelerations yesterday. Continue monitoring. [place]

## 2017-03-11 ENCOUNTER — Inpatient Hospital Stay (HOSPITAL_COMMUNITY): Payer: BLUE CROSS/BLUE SHIELD

## 2017-03-11 MED ORDER — CYCLOBENZAPRINE HCL 10 MG PO TABS: 10.0000 mg | ORAL_TABLET | Freq: Three times a day (TID) | ORAL | 0 refills | Status: DC | PRN

## 2017-03-11 NOTE — Discharge Instructions (Signed)
Preterm labor precautions, daily kick cts

## 2017-03-11 NOTE — Plan of Care (Signed)
Problem: Anxiety Goal: Anxiety is at manageable level Outcome: Progressing Patient demonstrates appropriate anxiety level.

## 2017-03-11 NOTE — Progress Notes (Signed)
HD # 4 S: no complaint  (+) FM  O: BP 123/70 (BP Location: Right Arm)   Pulse (!) 56   Temp 98.3 F (36.8 C) (Oral)   Resp 20   Ht 5\' 2"  (1.575 m)   Wt 79.2 kg (174 lb 8 oz)   SpO2 98%   BMI 31.92 kg/m   Lungs clear to A Cor RRR Abd soft gravid non tender Ext no edema or calf tenderness VE ft/90/-2  Tracing: baseline 150 NR (-) ctx   IMP: PTL stable. BMZ complete SGA IUP @ 32 3/7 weeks P) BPP. If reassuring, d/c home. F/u one week. PTL precautions OOW due to PTL and SGA baby

## 2017-03-14 ENCOUNTER — Encounter (HOSPITAL_COMMUNITY): Payer: Self-pay | Admitting: Emergency Medicine

## 2017-03-14 ENCOUNTER — Encounter (HOSPITAL_COMMUNITY): Payer: Self-pay
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
## 2017-03-29 ENCOUNTER — Ambulatory Visit (INDEPENDENT_AMBULATORY_CARE_PROVIDER_SITE_OTHER): Payer: Self-pay | Admitting: Pediatrics

## 2017-03-29 ENCOUNTER — Inpatient Hospital Stay (HOSPITAL_COMMUNITY)
Admission: AD | Admit: 2017-03-29 | Discharge: 2017-03-29 | Disposition: A | Discharge status: Home / Self Care | Payer: BLUE CROSS/BLUE SHIELD | Source: Ambulatory Visit | Attending: Obstetrics and Gynecology | Admitting: Obstetrics and Gynecology

## 2017-03-29 ENCOUNTER — Encounter (HOSPITAL_COMMUNITY): Payer: Self-pay | Admitting: *Deleted

## 2017-03-29 ENCOUNTER — Inpatient Hospital Stay (HOSPITAL_COMMUNITY): Payer: BLUE CROSS/BLUE SHIELD

## 2017-03-29 DIAGNOSIS — O368390 Maternal care for abnormalities of the fetal heart rate or rhythm, unspecified trimester, not applicable or unspecified: Secondary | ICD-10-CM | POA: Diagnosis not present

## 2017-03-29 DIAGNOSIS — Z3A35 35 weeks gestation of pregnancy: Secondary | ICD-10-CM | POA: Insufficient documentation

## 2017-03-29 DIAGNOSIS — O365931 Maternal care for other known or suspected poor fetal growth, third trimester, fetus 1: Secondary | ICD-10-CM

## 2017-03-29 DIAGNOSIS — Z7681 Expectant parent(s) prebirth pediatrician visit: Secondary | ICD-10-CM

## 2017-03-29 LAB — GROUP B STREP BY PCR: Group B strep by PCR: NEGATIVE

## 2017-03-29 NOTE — Discharge Instructions (Signed)
Labor precautions, daily kick ct 

## 2017-03-29 NOTE — Progress Notes (Signed)
Prenatal counseling for impending newborn done-- 1st child, currently 34 weeks but measuring small, no other complications, good prenatal care  Z76.81

## 2017-03-29 NOTE — MAU Provider Note (Signed)
History     Chief Complaint  Patient presents with  . decel in FH   HPI> 31 yo G5P0040 BF @ [redacted] weeks gestation sent from the office due to fetal heart rate deceleration and NR NST at the office. PNC complicated by IUGR. BMZ complete  OB History    Gravida Para Term Preterm AB Living   5 0 0 0 4     SAB TAB Ectopic Multiple Live Births   2 2 0          Past Medical History:  Diagnosis Date  . Eczema   . Headache(784.0)   . Herpes   . Medical history non-contributory     Past Surgical History:  Procedure Laterality Date  . DILATION AND CURETTAGE OF UTERUS    . KNEE ARTHROSCOPY Left   . KNEE SURGERY      Family History  Problem Relation Age of Onset  . Hypertension Mother   . Asthma Mother   . Cancer Mother   . Cancer Father   . GER disease Brother     Social History  Substance Use Topics  . Smoking status: Never Smoker  . Smokeless tobacco: Never Used  . Alcohol use No    Allergies:  Allergies  Allergen Reactions  . Tomato Other (See Comments)    acne  . Tomato Rash    Prescriptions Prior to Admission  Medication Sig Dispense Refill Last Dose  . acetaminophen (TYLENOL) 325 MG tablet Take 650 mg by mouth every 6 (six) hours as needed.   Past Month at Unknown time  . calcium carbonate (TUMS - DOSED IN MG ELEMENTAL CALCIUM) 500 MG chewable tablet Chew 1-2 tablets by mouth daily.   Past Week at Unknown time  . cyclobenzaprine (FLEXERIL) 10 MG tablet Take 1 tablet (10 mg total) by mouth 3 (three) times daily as needed for muscle spasms. 15 tablet 0   . cyclobenzaprine (FLEXERIL) 10 MG tablet Take 1 tablet (10 mg total) by mouth 3 (three) times daily as needed for muscle spasms. 30 tablet 0   . Prenatal Vit-Fe Fumarate-FA (PRENATAL MULTIVITAMIN) TABS tablet Take 1 tablet by mouth daily at 12 noon.   Past Week at Unknown time  . Prenatal Vit-Fe Fumarate-FA (PRENATAL VITAMIN) 27-0.8 MG TABS Take 1 tablet by mouth every morning. 30 tablet 0 02/17/2017 at Unknown time      Physical Exam   Blood pressure 119/77, pulse (!) 113, temperature 98.3 F (36.8 C), resp. rate 18, weight 79.8 kg (176 lb), unknown if currently breastfeeding.  General appearance: alert, cooperative and no distress Abdomen: gravid Pelvic: deferred Extremities: extremities normal, atraumatic, no cyanosis or edema   IUGR IUP@ 35 P) NST. BPP, dopplers. Rapid GBS( in anticipation for delivery).  Addendum: GBS cx neg. NST reactive. Baseline 150 (+) accels no decel. Labor prec P) f/u office 9/13 MDM   Steadman Prosperi A, MD 6:06 PM 03/29/2017

## 2017-03-29 NOTE — MAU Note (Signed)
Pt presents to MAU after appointment with Dr Cherly Hensenousins for non reactive NST. Denies any Pain or VB

## 2017-04-08 ENCOUNTER — Inpatient Hospital Stay (HOSPITAL_COMMUNITY)
Admission: AD | Admit: 2017-04-08 | Discharge: 2017-04-11 | DRG: 775 | Disposition: A | Discharge status: Home / Self Care | Payer: BLUE CROSS/BLUE SHIELD | Source: Ambulatory Visit | Attending: Obstetrics and Gynecology | Admitting: Obstetrics and Gynecology

## 2017-04-08 ENCOUNTER — Encounter (HOSPITAL_COMMUNITY): Payer: Self-pay

## 2017-04-08 DIAGNOSIS — D62 Acute posthemorrhagic anemia: Secondary | ICD-10-CM | POA: Diagnosis not present

## 2017-04-08 DIAGNOSIS — Z3A36 36 weeks gestation of pregnancy: Secondary | ICD-10-CM | POA: Diagnosis not present

## 2017-04-08 DIAGNOSIS — O9081 Anemia of the puerperium: Secondary | ICD-10-CM | POA: Diagnosis not present

## 2017-04-08 DIAGNOSIS — O365930 Maternal care for other known or suspected poor fetal growth, third trimester, not applicable or unspecified: Principal | ICD-10-CM | POA: Diagnosis present

## 2017-04-08 DIAGNOSIS — O365931 Maternal care for other known or suspected poor fetal growth, third trimester, fetus 1: Secondary | ICD-10-CM | POA: Diagnosis present

## 2017-04-08 LAB — CBC
HEMATOCRIT: 32.4 % — AB (ref 36.0–46.0)
Hemoglobin: 11.8 g/dL — ABNORMAL LOW (ref 12.0–15.0)
MCH: 29.2 pg (ref 26.0–34.0)
MCHC: 36.4 g/dL — AB (ref 30.0–36.0)
MCV: 80.2 fL (ref 78.0–100.0)
Platelets: 248 10*3/uL (ref 150–400)
RBC: 4.04 MIL/uL (ref 3.87–5.11)
RDW: 15.3 % (ref 11.5–15.5)
WBC: 7 10*3/uL (ref 4.0–10.5)

## 2017-04-08 LAB — TYPE AND SCREEN
ABO/RH(D): O POS
ANTIBODY SCREEN: NEGATIVE

## 2017-04-08 MED ORDER — ACETAMINOPHEN 325 MG PO TABS
650.0000 mg | ORAL_TABLET | ORAL | Status: DC | PRN
  Administered 2017-04-09: 650 mg via ORAL
  Filled 2017-04-08: qty 2

## 2017-04-08 MED ORDER — OXYTOCIN BOLUS FROM INFUSION
500.0000 mL | Freq: Once | INTRAVENOUS | Status: AC
  Administered 2017-04-09: 500 mL via INTRAVENOUS

## 2017-04-08 MED ORDER — LACTATED RINGERS IV SOLN: 500.0000 mL | INTRAVENOUS | Status: DC | PRN

## 2017-04-08 MED ORDER — BUTORPHANOL TARTRATE 1 MG/ML IJ SOLN: 2.0000 mg | INTRAMUSCULAR | Status: DC | PRN

## 2017-04-08 MED ORDER — LIDOCAINE HCL (PF) 1 % IJ SOLN
30.0000 mL | INTRAMUSCULAR | Status: DC | PRN
  Filled 2017-04-08: qty 30

## 2017-04-08 MED ORDER — OXYTOCIN 40 UNITS IN LACTATED RINGERS INFUSION - SIMPLE MED: 2.5000 [IU]/h | INTRAVENOUS | Status: DC

## 2017-04-08 MED ORDER — OXYCODONE-ACETAMINOPHEN 5-325 MG PO TABS
2.0000 | ORAL_TABLET | ORAL | Status: DC | PRN
Start: 2017-04-08 — End: 2017-04-09

## 2017-04-08 MED ORDER — LACTATED RINGERS IV SOLN
INTRAVENOUS | Status: DC
  Administered 2017-04-08: 14:00:00 via INTRAVENOUS

## 2017-04-08 MED ORDER — PROMETHAZINE HCL 25 MG/ML IJ SOLN: 25.0000 mg | Freq: Four times a day (QID) | INTRAMUSCULAR | Status: DC | PRN

## 2017-04-08 MED ORDER — SOD CITRATE-CITRIC ACID 500-334 MG/5ML PO SOLN: 30.0000 mL | ORAL | Status: DC | PRN

## 2017-04-08 MED ORDER — ONDANSETRON HCL 4 MG/2ML IJ SOLN: 4.0000 mg | Freq: Four times a day (QID) | INTRAMUSCULAR | Status: DC | PRN

## 2017-04-08 MED ORDER — BUTORPHANOL TARTRATE 1 MG/ML IJ SOLN
1.0000 mg | INTRAMUSCULAR | Status: DC | PRN
  Administered 2017-04-09: 1 mg via INTRAVENOUS
  Filled 2017-04-08: qty 1

## 2017-04-08 MED ORDER — OXYCODONE-ACETAMINOPHEN 5-325 MG PO TABS: 1.0000 | ORAL_TABLET | ORAL | Status: DC | PRN

## 2017-04-08 MED ORDER — TERBUTALINE SULFATE 1 MG/ML IJ SOLN
0.2500 mg | Freq: Once | INTRAMUSCULAR | Status: DC | PRN
  Filled 2017-04-08: qty 1

## 2017-04-08 MED ORDER — OXYTOCIN 40 UNITS IN LACTATED RINGERS INFUSION - SIMPLE MED
1.0000 m[IU]/min | INTRAVENOUS | Status: DC
  Administered 2017-04-08 – 2017-04-09 (×2): 2 m[IU]/min via INTRAVENOUS
  Filled 2017-04-08: qty 1000

## 2017-04-08 MED ORDER — OXYTOCIN 10 UNIT/ML IJ SOLN: 10.0000 [IU] | Freq: Once | INTRAMUSCULAR | Status: DC

## 2017-04-08 NOTE — Anesthesia Pain Management Evaluation Note (Signed)
  [facility] Pain Management Visit Note  Patient: Megan Pena, 31 y.o., female  "Hello I am a member of the anesthesia team at [facility]. We have an anesthesia team available at all times to provide care throughout the hospital, including epidural management and anesthesia for C-section. I don't know your plan for the delivery whether it a natural birth, water birth, IV sedation, nitrous supplementation, doula or epidural, but we want to meet your pain goals."   1.Was your pain managed to your expectations on prior hospitalizations?   No prior hospitalizations  2.What is your expectation for pain management during this hospitalization?     Labor support without medications  3.How can we help you reach that goal? unsure  Record the patient's initial score and the patient's pain goal.   Pain: 3  Pain Goal: 3 [facility] wants you to be able to say your pain was always managed very well.  Cephus Shelling 04/08/2017

## 2017-04-08 NOTE — MAU Note (Signed)
Patient sent for induction of labor, no bed available in BS; induced for severe IUGR
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
## 2017-04-09 ENCOUNTER — Inpatient Hospital Stay (HOSPITAL_COMMUNITY): Payer: BLUE CROSS/BLUE SHIELD | Admitting: Anesthesiology

## 2017-04-09 LAB — RPR: RPR Ser Ql: NONREACTIVE

## 2017-04-09 MED ORDER — FENTANYL 2.5 MCG/ML BUPIVACAINE 1/10 % EPIDURAL INFUSION (WH - ANES)
14.0000 mL/h | INTRAMUSCULAR | Status: DC | PRN
  Administered 2017-04-09: 14 mL/h via EPIDURAL

## 2017-04-09 MED ORDER — ONDANSETRON HCL 4 MG/2ML IJ SOLN: 4.0000 mg | INTRAMUSCULAR | Status: DC | PRN

## 2017-04-09 MED ORDER — LACTATED RINGERS IV SOLN
500.0000 mL | Freq: Once | INTRAVENOUS | Status: AC
  Administered 2017-04-09: 500 mL via INTRAVENOUS

## 2017-04-09 MED ORDER — SENNOSIDES-DOCUSATE SODIUM 8.6-50 MG PO TABS
2.0000 | ORAL_TABLET | ORAL | Status: DC
  Administered 2017-04-10 (×2): 2 via ORAL
  Filled 2017-04-09 (×2): qty 2

## 2017-04-09 MED ORDER — LACTATED RINGERS IV SOLN
INTRAVENOUS | Status: DC
  Administered 2017-04-09 (×2): via INTRAUTERINE

## 2017-04-09 MED ORDER — EPHEDRINE 5 MG/ML INJ
10.0000 mg | INTRAVENOUS | Status: DC | PRN
  Filled 2017-04-09: qty 2

## 2017-04-09 MED ORDER — IBUPROFEN 600 MG PO TABS
600.0000 mg | ORAL_TABLET | Freq: Four times a day (QID) | ORAL | Status: DC
  Administered 2017-04-09 – 2017-04-11 (×9): 600 mg via ORAL
  Filled 2017-04-09 (×10): qty 1

## 2017-04-09 MED ORDER — PHENYLEPHRINE 40 MCG/ML (10ML) SYRINGE FOR IV PUSH (FOR BLOOD PRESSURE SUPPORT)
80.0000 ug | PREFILLED_SYRINGE | INTRAVENOUS | Status: DC | PRN
  Filled 2017-04-09: qty 5

## 2017-04-09 MED ORDER — BENZOCAINE-MENTHOL 20-0.5 % EX AERO
1.0000 "application " | INHALATION_SPRAY | CUTANEOUS | Status: DC | PRN
  Administered 2017-04-10: 1 via TOPICAL
  Filled 2017-04-09: qty 56

## 2017-04-09 MED ORDER — ZOLPIDEM TARTRATE 5 MG PO TABS: 5.0000 mg | ORAL_TABLET | Freq: Every evening | ORAL | Status: DC | PRN

## 2017-04-09 MED ORDER — COCONUT OIL OIL: 1.0000 "application " | TOPICAL_OIL | Status: DC | PRN

## 2017-04-09 MED ORDER — PHENYLEPHRINE 40 MCG/ML (10ML) SYRINGE FOR IV PUSH (FOR BLOOD PRESSURE SUPPORT)
PREFILLED_SYRINGE | INTRAVENOUS | Status: AC
  Filled 2017-04-09: qty 20

## 2017-04-09 MED ORDER — PRENATAL MULTIVITAMIN CH
1.0000 | ORAL_TABLET | Freq: Every day | ORAL | Status: DC
  Administered 2017-04-09 – 2017-04-11 (×3): 1 via ORAL
  Filled 2017-04-09 (×3): qty 1

## 2017-04-09 MED ORDER — SIMETHICONE 80 MG PO CHEW: 80.0000 mg | CHEWABLE_TABLET | ORAL | Status: DC | PRN

## 2017-04-09 MED ORDER — FERROUS SULFATE 325 (65 FE) MG PO TABS
325.0000 mg | ORAL_TABLET | Freq: Two times a day (BID) | ORAL | Status: DC
  Administered 2017-04-09 – 2017-04-11 (×4): 325 mg via ORAL
  Filled 2017-04-09 (×5): qty 1

## 2017-04-09 MED ORDER — WITCH HAZEL-GLYCERIN EX PADS: 1.0000 "application " | MEDICATED_PAD | CUTANEOUS | Status: DC | PRN

## 2017-04-09 MED ORDER — PHENYLEPHRINE 40 MCG/ML (10ML) SYRINGE FOR IV PUSH (FOR BLOOD PRESSURE SUPPORT)
80.0000 ug | PREFILLED_SYRINGE | INTRAVENOUS | Status: DC | PRN
  Administered 2017-04-09: 80 ug via INTRAVENOUS
  Filled 2017-04-09: qty 5

## 2017-04-09 MED ORDER — ACETAMINOPHEN 325 MG PO TABS
650.0000 mg | ORAL_TABLET | ORAL | Status: DC | PRN
  Administered 2017-04-09 – 2017-04-11 (×3): 650 mg via ORAL
  Filled 2017-04-09 (×3): qty 2

## 2017-04-09 MED ORDER — FENTANYL 2.5 MCG/ML BUPIVACAINE 1/10 % EPIDURAL INFUSION (WH - ANES)
INTRAMUSCULAR | Status: AC
  Filled 2017-04-09: qty 100

## 2017-04-09 MED ORDER — DIBUCAINE 1 % RE OINT: 1.0000 "application " | TOPICAL_OINTMENT | RECTAL | Status: DC | PRN

## 2017-04-09 MED ORDER — DIPHENHYDRAMINE HCL 25 MG PO CAPS: 25.0000 mg | ORAL_CAPSULE | Freq: Four times a day (QID) | ORAL | Status: DC | PRN

## 2017-04-09 MED ORDER — DIPHENHYDRAMINE HCL 50 MG/ML IJ SOLN: 12.5000 mg | INTRAMUSCULAR | Status: DC | PRN

## 2017-04-09 MED ORDER — ONDANSETRON HCL 4 MG PO TABS: 4.0000 mg | ORAL_TABLET | ORAL | Status: DC | PRN

## 2017-04-09 NOTE — Progress Notes (Signed)
S: notes some ctx Pitocin off due to late decels per RN O: BP 122/76   Pulse 60   Temp 98.3 F (36.8 C) (Oral)   Resp 16   Ht  (1.575 m)   Wt 77.1 kg (170 lb)   BMI 31.09 kg/m  VE 3/90/-1 AROM clear fluid IUPC/ISE placed Tracing: baseline 140  FHR deceleration to 65 noted shortly after AROM Positional changes done with resolution of  Decel.  Unsure if related to ctx Tracing now without decel or accel. (+) variability  Ctx q 2-4 mins  IMP: severe IUGR IUP @ 36 4/7 weeks P) Epidural( pt advised due to possible need for stat/urgent c/s). Pt understands and agrees. Resume pitocin with cont reassuring tracing

## 2017-04-09 NOTE — Anesthesia Procedure Notes (Signed)
Epidural Patient location during procedure: OB Start time: 04/09/2017 3:05 AM  Staffing Anesthesiologist: Phillips Grout Performed: anesthesiologist   Preanesthetic Checklist Completed: patient identified, site marked, surgical consent, pre-op evaluation, timeout performed, IV checked, risks and benefits discussed and monitors and equipment checked  Epidural Patient position: sitting Prep: DuraPrep Patient monitoring: heart rate, continuous pulse ox and blood pressure Location: L3-L4 Injection technique: LOR saline  Needle:  Needle type: Tuohy  Needle gauge: 17 G Needle length: 9 cm and 9 Needle insertion depth: 7 cm Catheter type: closed end flexible Catheter size: 20 Guage Catheter at skin depth: 12 cm Test dose: negative and 2% lidocaine with Epi 1:200 K  Assessment Events: blood not aspirated, injection not painful, no injection resistance, negative IV test and no paresthesia  Additional Notes Patient identified. Risks/Benefits/Options discussed with patient including but not limited to bleeding, infection, nerve damage, paralysis, failed block, incomplete pain control, headache, blood pressure changes, nausea, vomiting, reactions to medication both or allergic, itching and postpartum back pain. Confirmed with bedside nurse the patient's most recent platelet count. Confirmed with patient that they are not currently taking any anticoagulation, have any bleeding history or any family history of bleeding disorders. Patient expressed understanding and wished to proceed. All questions were answered. Sterile technique was used throughout the entire procedure. Please see nursing notes for vital signs. Test dose was given through epidural needle and negative prior to continuing to dose epidural or start infusion.   All questions and concerns addressed with instructions to call with any issues.

## 2017-04-09 NOTE — Progress Notes (Signed)
Called  By RN at 6:47 am regarding complete with repetitive deep variable decelerations with ctx. Pitocin was discontinued. On arrival . Pt is complete +2 station. Variables resolved and early decelerations noted.  Start pushing. See delivery note

## 2017-04-09 NOTE — Lactation Note (Signed)
This note was copied from a baby's chart. Lactation Consultation Note  Patient Name: Megan Pena [id number] Date: 04/09/2017 Reason for consult: Initial assessment;Late-preterm 34-36.6wks;NICU baby;Infant < 6lbs Infant is 9 hours old & seen by [facility] for Initial Assessment. Baby was born at [redacted]w[redacted]d and weighed 4 lbs 6.9 oz at birth. [facility] consulted with mom at her bedside. Mom reports she has pumped a few times but has not seen much milk; mom reports she saw a small amount of liquid from one of her breasts but none from the other. Provided NICU booklet & reviewed information about expectations and milk volume. Also reviewed milk storage guidelines. Encouraged mom to pump at least 8x in 24hrs for 15-20 mins; showed mom how to use Initiate setting on DEBP and encouraged mom to use this setting until she sees 20 mL consistently. Also reviewed cleaning of parts and how to convert to manual pump. Mom reports she has insurance so will contact them to get a pump; discussed how she could rent a pump from [employer] as well if she doesn't have a pump on day of discharge.  Reviewed hand expression with mom and encouraged her to do so after pumping. Mom reports no questions at this time. Encouraged mom to ask for help as needed.  Maternal Data    Feeding    LATCH Score                   Interventions Interventions: Breast feeding basics reviewed;DEBP  Lactation Tools Discussed/Used     Consult Status Consult Status: Follow-up Date: 04/10/17 Follow-up type: In-patient    Oneal Grout 04/09/2017, 4:45 PM

## 2017-04-09 NOTE — Progress Notes (Signed)
Taking pt to NICU prior to transfer to [facility]. Vitals WNL, bleeding stable. Notified MBU RN with plan.

## 2017-04-09 NOTE — Progress Notes (Signed)
Epidural in  Progress Pitocin Tracing: baseline 140 (+) variable decel with ctx down to 80 x 20 sec with return baseline quickly Ctx q  2-4 mins Each ctx 50-60 MVU  IMP: latent phase labor Severe IUGR 36 4/7 week P) amnioinfusion when epidural done

## 2017-04-09 NOTE — Anesthesia Postprocedure Evaluation (Signed)
Anesthesia Post Note  Patient: Megan Pena  Procedure(s) Performed: * No procedures listed *     Patient location during evaluation: Mother Baby Anesthesia Type: Epidural Level of consciousness: awake and alert and oriented Pain management: pain level controlled Vital Signs Assessment: post-procedure vital signs reviewed and stable Respiratory status: spontaneous breathing and nonlabored ventilation Cardiovascular status: stable Postop Assessment: no headache, patient able to bend at knees, no backache, no apparent nausea or vomiting, epidural receding and adequate PO intake Anesthetic complications: no    Last Vitals:  Vitals:   04/09/17 0950 04/09/17 1104  BP: 130/79 120/68  Pulse: 88 81  Resp: 16   Temp: 36.7 C 36.8 C  SpO2: 98%     Last Pain:  Vitals:   04/09/17 1104  TempSrc: Oral  PainSc: 0-No pain   Pain Goal:                 [employer]

## 2017-04-09 NOTE — Anesthesia Preprocedure Evaluation (Signed)
Anesthesia Evaluation  Patient identified by MRN, date of birth, ID band Patient awake    History of Anesthesia Complications Negative for: history of anesthetic complications  Airway Mallampati: II       Dental no notable dental hx.    Pulmonary neg pulmonary ROS,    Pulmonary exam normal        Cardiovascular negative cardio ROS Normal cardiovascular exam     Neuro/Psych  Headaches, neg Seizures negative psych ROS   GI/Hepatic negative GI ROS, Neg liver ROS,   Endo/Other  negative endocrine ROS  Renal/GU negative Renal ROS  negative genitourinary   Musculoskeletal negative musculoskeletal ROS (+)   Abdominal   Peds negative pediatric ROS (+)  Hematology negative hematology ROS (+)   Anesthesia Other Findings   Reproductive/Obstetrics (+) Pregnancy                             Anesthesia Physical Anesthesia Plan  ASA: II  Anesthesia Plan: Epidural   Post-op Pain Management:    Induction:   PONV Risk Score and Plan: 1 and Ondansetron and Dexamethasone  Airway Management Planned:   Additional Equipment:   Intra-op Plan:   Post-operative Plan:   Informed Consent: I have reviewed the patients History and Physical, chart, labs and discussed the procedure including the risks, benefits and alternatives for the proposed anesthesia with the patient or authorized representative who has indicated his/her understanding and acceptance.     Plan Discussed with:   Anesthesia Plan Comments:         Anesthesia Quick Evaluation

## 2017-04-10 LAB — CBC
HCT: 27.8 % — ABNORMAL LOW (ref 36.0–46.0)
Hemoglobin: 10.1 g/dL — ABNORMAL LOW (ref 12.0–15.0)
MCH: 28.9 pg (ref 26.0–34.0)
MCHC: 36.3 g/dL — AB (ref 30.0–36.0)
MCV: 79.7 fL (ref 78.0–100.0)
PLATELETS: 196 10*3/uL (ref 150–400)
RBC: 3.49 MIL/uL — ABNORMAL LOW (ref 3.87–5.11)
RDW: 15.5 % (ref 11.5–15.5)
WBC: 9.4 10*3/uL (ref 4.0–10.5)
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
## 2017-04-10 NOTE — Progress Notes (Signed)
PPD # 1 VAVD, IOL for IUGR Information for the patient's newborn:  Megan Pena, Girl [place] [[id number]]  female    breast feeding - pumping, minimal expression yet   S:  Reports feeling well, appropriately sad baby in NICU             Tolerating po/ No nausea or vomiting             Bleeding is light             Pain controlled with ibuprofen (OTC)             Up ad lib / ambulatory / voiding without difficulties        O:  A & O x 3, in no apparent distress              VS:  Vitals:   04/09/17 1104 04/09/17 1450 04/09/17 2300 04/10/17 0600  BP: 120/68 131/80 120/64 (!) 116/56  Pulse: 81 70 69 60  Resp:   18 18  Temp: 98.2 F (36.8 C) 98 F (36.7 C) 98.8 F (37.1 C) 98.4 F (36.9 C)  TempSrc: Oral  Oral Oral  SpO2:   99% 99%  Weight:      Height:        LABS:  Recent Labs  04/08/17 1358 04/10/17 0553  WBC 7.0 9.4  HGB 11.8* 10.1*  HCT 32.4* 27.8*  PLT 248 196    Blood type: --/--/O POS (09/21 1358)  Rubella: Immune (03/19 0000)   I&O: I/O last 3 completed shifts: In: -  Out: 1250 [Urine:1050; Blood:200]          No intake/output data recorded.  Lungs: Clear and unlabored  Heart: regular rate and rhythm / no murmurs  Abdomen: soft, non-tender, non-distended             Fundus: firm, non-tender, U-1  Perineum: intact  Lochia: small  Extremities: no edema, no calf pain or tenderness    A/P: PPD # 1 31 y.o., [id number]   Principal Problem:   Postpartum care following vaginal delivery 9/22 Active Problems:   IUGR (intrauterine growth restriction) affecting care of mother, third trimester, fetus 1   Vacuum extractor delivery, delivered   Doing well - stable status  Routine post partum orders  Anticipate discharge tomorrow    Neta Mends, MSN, CNM 04/10/2017, 4:53 PM

## 2017-04-11 ENCOUNTER — Encounter (HOSPITAL_COMMUNITY): Payer: Self-pay | Admitting: *Deleted

## 2017-04-11 MED ORDER — IBUPROFEN 600 MG PO TABS: 600.0000 mg | ORAL_TABLET | Freq: Four times a day (QID) | ORAL | 0 refills | Status: AC

## 2017-04-11 MED ORDER — CITRANATAL 90 DHA 90-1 & 300 MG PO MISC: 1.0000 | Freq: Every day | ORAL | 3 refills | Status: AC
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
## 2017-04-11 NOTE — Progress Notes (Signed)
CSW attempted to meet with MOB in her first floor room to offer support and complete assessment due to NICU admission, but she was not present at this time.  CSW will attempt again at a later time if possible.  No social concerns noted upon chart review.

## 2017-04-11 NOTE — Progress Notes (Signed)
PPD #2, VAVD, baby girl "Amirah"  S:  Reports feeling good, coping well with baby in NICU - appropriately sad             Tolerating po/ No nausea or vomiting / Denies dizziness or SOB             Bleeding is light             Pain controlled with Motrin and Tylenol             Up ad lib / ambulatory / voiding QS  Newborn in NICU - on ventilator / mom pumping minimal colostrum   O:               VS: BP 124/72 (BP Location: Right Arm)   Pulse 66   Temp 98.5 F (36.9 C) (Oral)   Resp 18   Ht  (1.575 m)   Wt 77.1 kg (170 lb)   SpO2 99%   BMI 31.09 kg/m    LABS:              Recent Labs  04/08/17 1358 04/10/17 0553  WBC 7.0 9.4  HGB 11.8* 10.1*  PLT 248 196               Blood type: --/--/O POS (09/21 1358)  Rubella: Immune (03/19 0000)                     I&O: Intake/Output    None                 Physical Exam:             Alert and oriented X3  Lungs: Clear and unlabored  Heart: regular rate and rhythm / no mumurs  Abdomen: soft, non-tender, non-distended              Fundus: firm, non-tender, U-2  Perineum: intact, no significant edema, erythema, or ecchymosis   Lochia: appropriate, scant, no clots   Extremities: +1 left lower extremity edema - TED hose on, trace right lower extremity edema, no calf pain or tenderness     A: PPD # 2, VAVD  ABL Anemia - stable on oral FE, asymptomatic   S/p IOL for IUGR  Doing well - stable status  P: Routine post partum orders  Discharge home today  Encouraged to continue taking OTC Tylenol PRN for pain with Motrin  [facility] discharge instructions and booklet reviewed   Pt. Requests prenatal vitamin rx   Pt. Declined f/u for EPDS at 2 weeks PP, and will schedule a f/u with Dr. Cherly Hensen in 6 weeks - strict precautions given     Carlean Jews, MSN, CNM [facility]
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
## 2017-04-11 NOTE — Plan of Care (Signed)
Problem: Pain Management: Goal: General experience of comfort will improve and pain level will decrease Outcome: Completed/Met Date Met: 04/11/17 Pt reports pain 2/10 and is a dull ache in her abdomen and back.  Pt reports using heating packs which help to decrease the pain.  Pt declines any medication for pain at this time.   

## 2017-04-11 NOTE — Lactation Note (Signed)
This note was copied from a baby's chart. Lactation Consultation Note  Patient Name: Megan Pena Date: 04/11/2017   NICU baby 56 hours old. Mom requesting [facility] loaner. Enc mom to call [facility] for an appointment first, then call this [facility] if still needing a [facility] loaner. Mom states that she does not have the cash for the pump, yet, but would like to rent later after D/C. Mom given paperwork and evening LC aware that mom may call for assistance.   Mom aware of pumping rooms in NICU, and enc to keep pumping every 2-3 hours for a total of 8-12 times/24 hours. Mom reports that she is just getting drops now. Discussed progression of milk coming to volume and supply and demand. Enc mom to take pumping kit with her at D/C, and given bottles for EBM storage.   Maternal Data    Feeding    LATCH Score                   Interventions    Lactation Tools Discussed/Used     Consult Status      Megan Pena 04/11/2017, 3:55 PM
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
## 2020-02-07 ENCOUNTER — Other Ambulatory Visit: Payer: Self-pay | Admitting: Family Medicine

## 2020-02-07 DIAGNOSIS — Z1231 Encounter for screening mammogram for malignant neoplasm of breast: Secondary | ICD-10-CM

## 2020-02-11 ENCOUNTER — Other Ambulatory Visit: Payer: Self-pay | Admitting: Family Medicine

## 2020-02-11 DIAGNOSIS — Z1231 Encounter for screening mammogram for malignant neoplasm of breast: Secondary | ICD-10-CM

## 2021-06-04 ENCOUNTER — Other Ambulatory Visit: Payer: Self-pay

## 2021-06-04 ENCOUNTER — Emergency Department (HOSPITAL_BASED_OUTPATIENT_CLINIC_OR_DEPARTMENT_OTHER)
Admission: EM | Admit: 2021-06-04 | Discharge: 2021-06-04 | Disposition: A | Discharge status: Home / Self Care | Payer: 59 | Attending: Emergency Medicine | Admitting: Emergency Medicine

## 2021-06-04 ENCOUNTER — Encounter (HOSPITAL_BASED_OUTPATIENT_CLINIC_OR_DEPARTMENT_OTHER): Payer: Self-pay

## 2021-06-04 DIAGNOSIS — J020 Streptococcal pharyngitis: Secondary | ICD-10-CM | POA: Insufficient documentation

## 2021-06-04 DIAGNOSIS — J029 Acute pharyngitis, unspecified: Secondary | ICD-10-CM | POA: Diagnosis present

## 2021-06-04 DIAGNOSIS — Z20822 Contact with and (suspected) exposure to covid-19: Secondary | ICD-10-CM | POA: Insufficient documentation

## 2021-06-04 DIAGNOSIS — H9209 Otalgia, unspecified ear: Secondary | ICD-10-CM | POA: Insufficient documentation

## 2021-06-04 LAB — PREGNANCY, URINE: Preg Test, Ur: NEGATIVE

## 2021-06-04 LAB — GROUP A STREP BY PCR: Group A Strep by PCR: DETECTED — AB

## 2021-06-04 LAB — RESP PANEL BY RT-PCR (FLU A&B, COVID) ARPGX2
Influenza A by PCR: NEGATIVE
Influenza B by PCR: NEGATIVE
SARS Coronavirus 2 by RT PCR: NEGATIVE

## 2021-06-04 MED ORDER — AMOXICILLIN 500 MG PO CAPS: 500.0000 mg | ORAL_CAPSULE | Freq: Two times a day (BID) | ORAL | 0 refills | Status: AC

## 2021-06-04 NOTE — ED Provider Notes (Signed)
[facility] EMERGENCY DEPARTMENT Provider Note   CSN: [id number] Arrival date & time: 06/04/21  0855     History Chief Complaint  Patient presents with   Sore Throat    Megan Pena is a 35 y.o. female.  HPI  35 year old female presents emergency department concern for sore throat, body aches and ear pain since yesterday.  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  She states that she took TheraFlu yesterday with no significant improvement.  Presenting with worsening throat pain especially when swallowing.  No documented fever but she endorses chills.  No GI symptoms.  Past Medical History:  Diagnosis Date   Eczema    Headache(784.0)    Herpes    Medical history non-contributory     Patient Active Problem List   Diagnosis Date Noted   Postpartum care following VAVD (9/22) 04/09/2017   Vacuum extractor delivery, delivered 04/09/2017   IUGR (intrauterine growth restriction) affecting care of mother, third trimester, fetus 1 04/08/2017    Past Surgical History:  Procedure Laterality Date   DILATION AND CURETTAGE OF UTERUS     KNEE ARTHROSCOPY Left    KNEE SURGERY       OB History     Gravida  3   Para  0   Term  0   Preterm  0   AB  2   Living         SAB  2   IAB  0   Ectopic  0   Multiple      Live Births  0           Family History  Problem Relation Age of Onset   Hypertension Mother    Asthma Mother    Cancer Mother    Cancer Father    GER disease Brother     Social History   Tobacco Use   Smoking status: Never   Smokeless tobacco: Never  Substance Use Topics   Alcohol use: No   Drug use: No    Home Medications Prior to Admission medications   Medication Sig Start Date End Date Taking? Authorizing Provider  amoxicillin (AMOXIL) 500 MG capsule Take 1 capsule (500 mg total) by mouth 2 (two) times daily for 10 days. 06/04/21 06/14/21 Yes Dannie Woolen, Alvin Critchley, DO  ibuprofen  (ADVIL,MOTRIN) 600 MG tablet Take 1 tablet (600 mg total) by mouth every 6 (six) hours. 04/11/17   Sigmon, Tyler Deis, CNM  Prenat w/o A-FeCbGl-DSS-FA-DHA (CITRANATAL 90 DHA) 90-1 & 300 MG MISC Take 1 tablet by mouth daily. 04/11/17   Sigmon, Tyler Deis, CNM    Allergies    Tomato and Tomato  Review of Systems   Review of Systems  Constitutional:  Positive for chills. Negative for fever.  HENT:  Positive for congestion, ear pain, sinus pain and sore throat.   Eyes:  Negative for visual disturbance.  Respiratory:  Negative for cough and shortness of breath.   Cardiovascular:  Negative for chest pain.  Gastrointestinal:  Negative for abdominal pain, diarrhea and vomiting.  Genitourinary:  Negative for dysuria.  Skin:  Negative for rash.  Neurological:  Negative for headaches.   Physical Exam Updated Vital Signs BP (!) 142/90 (BP Location: Right Arm)   Pulse (!) 101   Temp 99.1 F (37.3 C) (Oral)   Resp 18   Ht 5\' 2"  (1.575 m)   Wt 78 kg   SpO2 98%   BMI 31.46 kg/m  Physical Exam Vitals and nursing note reviewed.  Constitutional:      General: She is not in acute distress.    Appearance: Normal appearance.  HENT:     Head: Normocephalic.     Right Ear: Tympanic membrane normal.     Left Ear: Tympanic membrane normal.     Mouth/Throat:     Mouth: Mucous membranes are moist.     Pharynx: Posterior oropharyngeal erythema present. No oropharyngeal exudate or uvula swelling.  Cardiovascular:     Rate and Rhythm: Normal rate.  Pulmonary:     Effort: Pulmonary effort is normal. No respiratory distress.  Abdominal:     Palpations: Abdomen is soft.     Tenderness: There is no abdominal tenderness.  Skin:    General: Skin is warm.  Neurological:     Mental Status: She is alert and oriented to person, place, and time. Mental status is at baseline.  Psychiatric:        Mood and Affect: Mood normal.    ED Results / Procedures / Treatments   Labs (all labs ordered are  listed, but only abnormal results are displayed) Labs Reviewed  GROUP A STREP BY PCR - Abnormal; Notable for the following components:      Result Value   Group A Strep by PCR DETECTED (*)    All other components within normal limits  RESP PANEL BY RT-PCR (FLU A&B, COVID) [id number]  PREGNANCY, URINE    EKG None  Radiology No results found.  Procedures Procedures   Medications Ordered in ED Medications - No data to display  ED Course  I have reviewed the triage vital signs and the nursing notes.  Pertinent labs & imaging results that were available during my care of the patient were reviewed by me and considered in my medical decision making (see chart for details).    MDM Rules/Calculators/A&P                           35 year old female presents emergency department with sore throat, myalgias and ear pain.  Vitals are stable on arrival.  Throat is erythematous, no exudates, mild swelling, uvula is midline and unremarkable.  TMs are clear bilaterally, rest of the physical exam is reassuring.  Flu and COVID are negative, strep test positive.  Plan to treat with antibiotic therapy and outpatient follow-up.  Patient at this time appears safe and stable for discharge and will be treated as an outpatient.  Discharge plan and strict return to ED precautions discussed, patient verbalizes understanding and agreement.  Final Clinical Impression(s) / ED Diagnoses Final diagnoses:  Strep pharyngitis    Rx / DC Orders ED Discharge Orders          Ordered    amoxicillin (AMOXIL) 500 MG capsule  2 times daily        06/04/21 1058             Arturo Sofranko, Clabe Seal, DO 06/04/21 1122

## 2021-06-04 NOTE — Discharge Instructions (Signed)
You have been seen and discharged from the emergency department.  You have been diagnosed with strep throat.  Take antibiotics as directed.  Use other over-the-counter medication like Tylenol/ibuprofen as needed for symptom control.  Stay well-hydrated.  Follow-up with your primary provider for reevaluation and further care. Take home medications as prescribed. If you have any worsening symptoms or further concerns for your health please return to an emergency department for further evaluation.

## 2021-06-04 NOTE — ED Notes (Signed)
Discharge instructions reviewed  with patient. Pt requesting medication to help numb throat. Spoke with provider. Only option is to use choloroseptic or warm salt water gargling. Acknowledged by patient

## 2021-06-04 NOTE — ED Triage Notes (Signed)
Pt c/o sore throat, cough, ear pain, bodyaches since yesterday. Took theraflu

## 2022-06-16 ENCOUNTER — Emergency Department (HOSPITAL_BASED_OUTPATIENT_CLINIC_OR_DEPARTMENT_OTHER): Payer: Medicaid Other

## 2022-06-16 ENCOUNTER — Encounter (HOSPITAL_BASED_OUTPATIENT_CLINIC_OR_DEPARTMENT_OTHER): Payer: Self-pay | Admitting: Urology

## 2022-06-16 ENCOUNTER — Emergency Department (HOSPITAL_BASED_OUTPATIENT_CLINIC_OR_DEPARTMENT_OTHER)
Admission: EM | Admit: 2022-06-16 | Discharge: 2022-06-16 | Disposition: A | Discharge status: Home / Self Care | Payer: Medicaid Other | Attending: Emergency Medicine | Admitting: Emergency Medicine

## 2022-06-16 ENCOUNTER — Other Ambulatory Visit: Payer: Self-pay

## 2022-06-16 DIAGNOSIS — J111 Influenza due to unidentified influenza virus with other respiratory manifestations: Secondary | ICD-10-CM

## 2022-06-16 DIAGNOSIS — J101 Influenza due to other identified influenza virus with other respiratory manifestations: Secondary | ICD-10-CM | POA: Insufficient documentation

## 2022-06-16 DIAGNOSIS — R059 Cough, unspecified: Secondary | ICD-10-CM | POA: Diagnosis present

## 2022-06-16 MED ORDER — GUAIFENESIN ER 1200 MG PO TB12: 1.0000 | ORAL_TABLET | Freq: Two times a day (BID) | ORAL | 0 refills | Status: AC

## 2022-06-16 MED ORDER — BENZONATATE 100 MG PO CAPS: 100.0000 mg | ORAL_CAPSULE | Freq: Three times a day (TID) | ORAL | 0 refills | Status: AC

## 2022-06-16 NOTE — Discharge Instructions (Signed)
Chest x-ray today did not show pneumonia.  Your symptoms from the flu may take longer to completely resolve.  Take the medications to help with your cough and congestion.

## 2022-06-16 NOTE — ED Provider Notes (Signed)
[facility] EMERGENCY DEPARTMENT Provider Note   CSN: [id number] Arrival date & time: 06/16/22  1232     History  Chief Complaint  Patient presents with   Nasal Congestion    Megan Pena is a 36 y.o. female.  HPI   Patient has a history of headaches and smell.  She presents to the ED for evaluation of persistent cough and chest congestion.  Patient states she was diagnosed with the flu on Friday.  She continues to have congestion in her chest.  She has been taking over-the-counter medications without relief.  She has noticed some brown-yellow mucus from the nose.  She is concerned she could be developing pneumonia.  No fevers.  No dyspnea.  Home Medications Prior to Admission medications   Medication Sig Start Date End Date Taking? Authorizing Provider  benzonatate (TESSALON) 100 MG capsule Take 1 capsule (100 mg total) by mouth every 8 (eight) hours. 06/16/22  Yes Dorie Rank, MD  Guaifenesin 1200 MG TB12 Take 1 tablet (1,200 mg total) by mouth 2 (two) times daily at 10 AM and 5 PM. 06/16/22  Yes Dorie Rank, MD  ibuprofen (ADVIL,MOTRIN) 600 MG tablet Take 1 tablet (600 mg total) by mouth every 6 (six) hours. 04/11/17   Sigmon, Tyler Deis, CNM  Prenat w/o A-[id number]-DHA (CITRANATAL 90 DHA) 90-1 & 300 MG MISC Take 1 tablet by mouth daily. 04/11/17   Darliss Cheney, CNM      Allergies    Tomato and Tomato    Review of Systems   Review of Systems  Physical Exam Updated Vital Signs BP 115/76 (BP Location: Left Arm)   Pulse 86   Temp 98.1 F (36.7 C) (Oral)   Resp 18   Ht 1.575 m (5\' 2" )   Wt 70.3 kg   SpO2 97%   BMI 28.35 kg/m  Physical Exam Vitals and nursing note reviewed.  Constitutional:      General: She is not in acute distress.    Appearance: She is well-developed.  HENT:     Head: Normocephalic and atraumatic.     Right Ear: External ear normal.     Left Ear: External ear normal.     Mouth/Throat:     Pharynx: No oropharyngeal exudate  or posterior oropharyngeal erythema.  Eyes:     General: No scleral icterus.       Right eye: No discharge.        Left eye: No discharge.     Conjunctiva/sclera: Conjunctivae normal.  Neck:     Trachea: No tracheal deviation.  Cardiovascular:     Rate and Rhythm: Normal rate.  Pulmonary:     Effort: Pulmonary effort is normal. No respiratory distress.     Breath sounds: Normal breath sounds. No stridor.  Abdominal:     General: There is no distension.  Musculoskeletal:        General: No swelling or deformity.     Cervical back: Neck supple.  Skin:    General: Skin is warm and dry.     Findings: No rash.  Neurological:     Mental Status: She is alert.     Cranial Nerves: Cranial nerve deficit: no gross deficits.     ED Results / Procedures / Treatments   Labs (all labs ordered are listed, but only abnormal results are displayed) Labs Reviewed - No data to display  EKG None  Radiology DG Chest 2 View  Result Date: 06/16/2022 CLINICAL DATA:  Cough and congestion.  EXAM: CHEST - 2 VIEW COMPARISON:  03/08/2017. FINDINGS: Trachea is midline. Heart size normal. Lungs are clear. No pleural fluid. IMPRESSION: No acute findings. Electronically Signed   By: Leanna Battles M.D.   On: 06/16/2022 14:02    Procedures Procedures    Medications Ordered in ED Medications - No data to display  ED Course/ Medical Decision Making/ A&P                           Medical Decision Making Problems Addressed: Influenza: acute illness or injury  Amount and/or Complexity of Data Reviewed Radiology: ordered and independent interpretation performed.  Risk OTC drugs. Prescription drug management.   Patient presented to the ED for evaluation of persistent cough and congestion.  Recently diagnosed with influenza.  Was concerned about the possibility developing pneumonia considering her persistent symptoms.  Chest x-ray fortunately without signs of pneumonia.  Will discharge home with  supportive treatment  Evaluation and diagnostic testing in the emergency department does not suggest an emergent condition requiring admission or immediate intervention beyond what has been performed at this time.  The patient is safe for discharge and has been instructed to return immediately for worsening symptoms, change in symptoms or any other concerns.        Final Clinical Impression(s) / ED Diagnoses Final diagnoses:  Influenza    Rx / DC Orders ED Discharge Orders          Ordered    Guaifenesin 1200 MG TB12  2 times daily        06/16/22 1425    benzonatate (TESSALON) 100 MG capsule  Every 8 hours        06/16/22 1425              Linwood Dibbles, MD 06/16/22 1428

## 2022-06-16 NOTE — ED Triage Notes (Signed)
Pt states Tested Positive for Flu A Friday, states continued chest congestion and sinus congestion States yellow/brown mucus from nose   Been taking tamiflu, and theraflu

## 2023-11-03 ENCOUNTER — Other Ambulatory Visit: Payer: Self-pay | Admitting: Occupational Medicine

## 2023-11-03 ENCOUNTER — Ambulatory Visit: Payer: Self-pay

## 2023-11-03 DIAGNOSIS — M79671 Pain in right foot: Secondary | ICD-10-CM

## 2023-11-03 DIAGNOSIS — M25571 Pain in right ankle and joints of right foot: Secondary | ICD-10-CM
